# Patient Record
Sex: Female | Born: 1960 | Race: White | Hispanic: No | Marital: Married | Smoking: Former smoker
Health system: Southern US, Community
[De-identification: ages and names within clinical notes are randomized; demographics above are authoritative.]

## PROBLEM LIST (undated history)

## (undated) DIAGNOSIS — I10 Essential (primary) hypertension: Secondary | ICD-10-CM

## (undated) DIAGNOSIS — J45909 Unspecified asthma, uncomplicated: Secondary | ICD-10-CM

## (undated) DIAGNOSIS — K219 Gastro-esophageal reflux disease without esophagitis: Secondary | ICD-10-CM

## (undated) DIAGNOSIS — R519 Headache, unspecified: Secondary | ICD-10-CM

## (undated) DIAGNOSIS — K760 Fatty (change of) liver, not elsewhere classified: Secondary | ICD-10-CM

## (undated) DIAGNOSIS — R51 Headache: Secondary | ICD-10-CM

## (undated) DIAGNOSIS — R7303 Prediabetes: Secondary | ICD-10-CM

## (undated) DIAGNOSIS — R7989 Other specified abnormal findings of blood chemistry: Secondary | ICD-10-CM

## (undated) DIAGNOSIS — E785 Hyperlipidemia, unspecified: Secondary | ICD-10-CM

## (undated) DIAGNOSIS — G4730 Sleep apnea, unspecified: Secondary | ICD-10-CM

## (undated) DIAGNOSIS — B009 Herpesviral infection, unspecified: Secondary | ICD-10-CM

## (undated) DIAGNOSIS — F419 Anxiety disorder, unspecified: Secondary | ICD-10-CM

## (undated) DIAGNOSIS — R945 Abnormal results of liver function studies: Secondary | ICD-10-CM

## (undated) DIAGNOSIS — K259 Gastric ulcer, unspecified as acute or chronic, without hemorrhage or perforation: Secondary | ICD-10-CM

## (undated) DIAGNOSIS — R739 Hyperglycemia, unspecified: Secondary | ICD-10-CM

## (undated) HISTORY — PX: OTHER SURGICAL HISTORY: SHX169

## (undated) HISTORY — DX: Fatty (change of) liver, not elsewhere classified: K76.0

## (undated) HISTORY — DX: Abnormal results of liver function studies: R94.5

## (undated) HISTORY — PX: COLONOSCOPY: SHX174

## (undated) HISTORY — PX: BREAST REDUCTION SURGERY: SHX8

## (undated) HISTORY — DX: Gastric ulcer, unspecified as acute or chronic, without hemorrhage or perforation: K25.9

## (undated) HISTORY — DX: Headache, unspecified: R51.9

## (undated) HISTORY — DX: Sleep apnea, unspecified: G47.30

## (undated) HISTORY — DX: Anxiety disorder, unspecified: F41.9

## (undated) HISTORY — DX: Gastro-esophageal reflux disease without esophagitis: K21.9

## (undated) HISTORY — DX: Prediabetes: R73.03

## (undated) HISTORY — DX: Other specified abnormal findings of blood chemistry: R79.89

## (undated) HISTORY — DX: Hyperglycemia, unspecified: R73.9

## (undated) HISTORY — DX: Essential (primary) hypertension: I10

## (undated) HISTORY — DX: Headache: R51

## (undated) HISTORY — DX: Unspecified asthma, uncomplicated: J45.909

## (undated) HISTORY — PX: POLYPECTOMY: SHX149

## (undated) HISTORY — DX: Herpesviral infection, unspecified: B00.9

## (undated) HISTORY — DX: Hyperlipidemia, unspecified: E78.5

---

## 2002-05-28 ENCOUNTER — Other Ambulatory Visit: Admission: RE | Admit: 2002-05-28 | Discharge: 2002-05-28 | Payer: Self-pay | Admitting: Obstetrics and Gynecology

## 2003-07-14 ENCOUNTER — Encounter: Admission: RE | Admit: 2003-07-14 | Discharge: 2003-07-14 | Payer: Self-pay | Admitting: Family Medicine

## 2003-09-09 ENCOUNTER — Other Ambulatory Visit: Admission: RE | Admit: 2003-09-09 | Discharge: 2003-09-09 | Payer: Self-pay | Admitting: Obstetrics and Gynecology

## 2004-01-20 ENCOUNTER — Emergency Department (HOSPITAL_COMMUNITY): Admission: EM | Admit: 2004-01-20 | Discharge: 2004-01-21 | Payer: Self-pay | Admitting: Emergency Medicine

## 2004-07-13 ENCOUNTER — Ambulatory Visit (HOSPITAL_COMMUNITY): Admission: RE | Admit: 2004-07-13 | Discharge: 2004-07-13 | Payer: Self-pay | Admitting: Orthopedic Surgery

## 2005-02-07 ENCOUNTER — Other Ambulatory Visit: Admission: RE | Admit: 2005-02-07 | Discharge: 2005-02-07 | Payer: Self-pay | Admitting: Obstetrics and Gynecology

## 2005-05-23 ENCOUNTER — Encounter: Admission: RE | Admit: 2005-05-23 | Discharge: 2005-05-23 | Payer: Self-pay | Admitting: Family Medicine

## 2008-04-17 ENCOUNTER — Encounter: Admission: RE | Admit: 2008-04-17 | Discharge: 2008-04-17 | Payer: Self-pay | Admitting: Family Medicine

## 2008-11-18 ENCOUNTER — Encounter: Admission: RE | Admit: 2008-11-18 | Discharge: 2008-11-18 | Payer: Self-pay | Admitting: Obstetrics and Gynecology

## 2010-01-14 ENCOUNTER — Encounter: Admission: RE | Admit: 2010-01-14 | Discharge: 2010-01-14 | Payer: Self-pay | Admitting: Obstetrics and Gynecology

## 2010-04-06 ENCOUNTER — Encounter (INDEPENDENT_AMBULATORY_CARE_PROVIDER_SITE_OTHER): Payer: Self-pay | Admitting: *Deleted

## 2010-08-08 ENCOUNTER — Encounter: Payer: Self-pay | Admitting: Obstetrics and Gynecology

## 2010-08-16 NOTE — Letter (Signed)
Summary: Colonoscopy Letter  [facility]  [address] [place], [place] [address]   Phone: [phone]  Fax: [phone]      April 06, 2010 MRN: [id number]   DANAE OLAND [address] [place], [place]  [address]   Dear Ms. Andrey Campanile,   According to your medical record, it is time for you to schedule a Colonoscopy. The American Cancer Society recommends this procedure as a method to detect early colon cancer. Patients with a family history of colon cancer, or a personal history of colon polyps or inflammatory bowel disease are at increased risk.  This letter has beeen generated based on the recommendations made at the time of your procedure. If you feel that in your particular situation this may no longer apply, please contact our office.  Please call our office at [phone] to schedule this appointment or to update your records at your earliest convenience.  Thank you for cooperating with [place] to provide you with the very best care possible.   Sincerely,  Hedwig Morton. Juanda Chance, M.D.  [facility] [phone]

## 2010-09-30 ENCOUNTER — Other Ambulatory Visit: Payer: Self-pay | Admitting: Internal Medicine

## 2010-10-03 ENCOUNTER — Encounter (INDEPENDENT_AMBULATORY_CARE_PROVIDER_SITE_OTHER): Payer: Medicare Other | Admitting: Internal Medicine

## 2010-10-03 DIAGNOSIS — I10 Essential (primary) hypertension: Secondary | ICD-10-CM

## 2010-10-03 DIAGNOSIS — E785 Hyperlipidemia, unspecified: Secondary | ICD-10-CM

## 2010-10-03 DIAGNOSIS — E119 Type 2 diabetes mellitus without complications: Secondary | ICD-10-CM

## 2010-12-02 NOTE — Op Note (Signed)
NAME:  Stephanie Alexander, Stephanie Alexander           ACCOUNT NO.:  [id number]   MEDICAL RECORD NO.:  [id number]          PATIENT TYPE:  AMB   LOCATION:  DAY                          FACILITY:  [facility]   PHYSICIAN:  Marlowe Kays, M.D.  DATE OF BIRTH:  07/16/1961   DATE OF PROCEDURE:  07/13/2004  DATE OF DISCHARGE:                                 OPERATIVE REPORT   PREOPERATIVE DIAGNOSES:  1.  Chronic impingement syndrome with rotator cuff tendinopathy.  2.  Labral degeneration, tendinopathy of biceps tendon, and a paralabral      cyst, right shoulder.   POSTOPERATIVE DIAGNOSES:  1.  Chronic impingement syndrome with rotator cuff tendinopathy.  2.  Labral degeneration, tendinopathy of biceps tendon, and paralabral cyst,      right shoulder.   PROCEDURE:  Right shoulder arthroscopy with (1) debridement of underneath  surface of rotator cuff, biceps tendon, and labrum; (2) arthroscopic  subacromial decompression.   SURGEON:  Marlowe Kays, M.D.   ASSISTANTDruscilla Brownie. Underwood, P.A.-C.   ANESTHESIA:  General.   INDICATIONS FOR PROCEDURE:  Painful right shoulder with MRI demonstrating  the above-noted pathologic entities.  Plan was to hope to correct the  paralabral cyst which may very well not have been a pathologic entity  causing symptoms with labral debridement.   DESCRIPTION OF PROCEDURE:  After satisfactory general anesthesia in the  beach chair position on the Schlein frame, the right shoulder was prepped  with DuraPrep and draped as a sterile field.  The anatomy of the shoulder  joint was marked out.  The subacromial space, posterior and lateral portals  were infiltrated with 0.5% Marcaine with adrenaline.   Through a posterior soft spot portal, I was able to atraumatically enter the  glenohumeral joint.  On inspection, found fraying of the underneath surface  of the rotator cuff, some degenerative fraying of the biceps tendon which  was basically intact, some labral degeneration  around the biceps anchor and  also posteriorly.  The humeral head and joint looked fine.  I advanced the  scope between the biceps and subscapularis using a switching stick.  I made  an anterior incision followed by a metal cannula and introduced a 4.2 shaver  where I shaved down the underneath surface of the rotator cuff, the biceps  tendon, the biceps anchor, and the anterior and posterior labrum.  The  labrum was not detached posteriorly.  I did not see any definite connection  from the posterior joint which I felt I visualized well.  After I had  completed all I could see to do in the glenohumeral joint, I redirected the  scope in the subacromial area.  With the lateral portal, I used a 4.2 shaver  to clean up a very active subacromial bursitis.  I then used the 9-degree  vaporizer to remove soft tissue from the underneath surface of the acromion,  distal clavicle, and anterior leading edge of the acromion.  I filed this  with a 4.0 oval bur and began decompressing the subacromial space.  I went  back and forth between the bur, vaporizer, and 4.2 shaver until the  rotator  cuff was smoothed down and I felt we had wide decompression as evidenced  with the arm to the side and arm abducted.  Documentary pictures were taken.  No frank tearing of the rotator cuff was noted.  All fluid possible was  removed from the subacromial space, and the three portals and subacromial  space were once again infiltrated with 0.5% Marcaine with adrenaline.  The  three portals were closed with 4-0 nylon followed by Betadine, Adaptic, dry  sterile dressing, and shoulder immobilizer.   She tolerated the procedure well and was taken to the recovery room in  satisfactory condition with no complications.      JA/MEDQ  D:  07/13/2004  T:  07/13/2004  Job:  [id number]

## 2011-02-14 ENCOUNTER — Other Ambulatory Visit: Payer: Self-pay | Admitting: Obstetrics and Gynecology

## 2011-02-14 DIAGNOSIS — R928 Other abnormal and inconclusive findings on diagnostic imaging of breast: Secondary | ICD-10-CM

## 2011-02-24 ENCOUNTER — Ambulatory Visit
Admission: RE | Admit: 2011-02-24 | Discharge: 2011-02-24 | Disposition: A | Discharge status: Home / Self Care | Payer: BC Managed Care – PPO | Source: Ambulatory Visit | Attending: Obstetrics and Gynecology | Admitting: Obstetrics and Gynecology

## 2011-02-24 DIAGNOSIS — R928 Other abnormal and inconclusive findings on diagnostic imaging of breast: Secondary | ICD-10-CM

## 2012-04-04 ENCOUNTER — Encounter: Payer: Self-pay | Admitting: Internal Medicine

## 2014-10-02 ENCOUNTER — Other Ambulatory Visit: Payer: Self-pay | Admitting: Family Medicine

## 2014-10-02 ENCOUNTER — Ambulatory Visit
Admission: RE | Admit: 2014-10-02 | Discharge: 2014-10-02 | Disposition: A | Discharge status: Home / Self Care | Payer: No Typology Code available for payment source | Source: Ambulatory Visit | Attending: Family Medicine | Admitting: Family Medicine

## 2014-10-02 DIAGNOSIS — R0602 Shortness of breath: Secondary | ICD-10-CM

## 2016-04-25 ENCOUNTER — Encounter: Payer: Self-pay | Admitting: Neurology

## 2016-04-25 ENCOUNTER — Ambulatory Visit (INDEPENDENT_AMBULATORY_CARE_PROVIDER_SITE_OTHER): Payer: Self-pay | Admitting: Neurology

## 2016-04-25 VITALS — BP 130/86 | HR 72 | Resp 20 | Ht 64.0 in

## 2016-04-25 DIAGNOSIS — F419 Anxiety disorder, unspecified: Secondary | ICD-10-CM

## 2016-04-25 DIAGNOSIS — F10982 Alcohol use, unspecified with alcohol-induced sleep disorder: Secondary | ICD-10-CM

## 2016-04-25 DIAGNOSIS — F5105 Insomnia due to other mental disorder: Secondary | ICD-10-CM

## 2016-04-25 DIAGNOSIS — R0683 Snoring: Secondary | ICD-10-CM

## 2016-04-25 NOTE — Progress Notes (Signed)
SLEEP MEDICINE CLINIC   Provider:  Larey Seat, M D  Referring Provider: Antony Blackbird, MD Primary Care Physician:  No primary care provider on file.  Chief Complaint  Patient presents with  . New Patient (Initial Visit)    snores at night, pt refused to be weighed    HPI:  Stephanie Alexander is a 55 y.o. female , seen here as a referral/ revisit  from Dr. Chapman Fitch for a sleep consultation.  [name] husband has been an established sleep patient of mine, and has mentioned multiple times that he is concerned about his wife's snoring and poorly restorative sleep. The patient herself reports that she has been told by friends and by her husband that they have witnessed her to snore very loudly. She is apparently regularly breathing, but snoring. He reports that she snores sleeping on her side as well as on her back.  Complicating her workup has been that the patient is currently not insured.  Sleep habits are as follows: Usually in the couple's bedtime is between 11 PM and midnight but she does not have to rise until 8 AM, work begins at 11 AM. She has no trouble initiating sleep, usually tries to fall asleep on her side, uses one pillow at night. The bedroom is cool, quiet and dark. She prefers a very cool temperature. Her problem is that she does not sleep through the night however, she can wake up up to 5 or 6 times at night. She sometimes goes to the bathroom but it is not the urge to urinate that wakes her. She's not sure what leads to these arousals. She's not really sure what triggers these arousals and they seem not to have an established time. She reports very visit dreams towards the morning hours, clearly the distribution of REM sleep. She wakes up with a dry mouth. She sleeps better in the morning.  She sometimes wakes up with headaches . No day time naps.  Years ago, her insomnia started during menopause. She was placed on Lexapro and Xanax. It worked for a while but did not last-  not working any longer. 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 4 cups of coffee in AM, no Sodas, No ea. Tobacco - none, ETOH 1- 2 glasses of wine at night.   Review of Systems: Out of a complete 14 system review, the patient complains of only the following symptoms, and all other reviewed systems are negative.  Fatigue severity scale endorsed at 14 points, Epworth sleepiness scale endorsed at 3 points.  I reviewed the patient's current medication list, her last laboratory tests which all reach back to 2014 there has been one hepatitis B immunization in 2011, she carries a diagnosis of migraine, allergic rhinitis, hypertension, anxiety. She also used to have asthma. Uses now when necessary inhaler,    Social History   Social History  . Marital status: Married    Spouse name: N/A  . Number of children: N/A  . Years of education: N/A   Occupational History  . Not on file.   Social History Main Topics  . Smoking status: Former Smoker    Years: 20.00  . Smokeless tobacco: Never Used  . Alcohol use 4.2 oz/week    7 Standard drinks or equivalent per week  . Drug use: No  .  Sexual activity: Not on file   Other Topics Concern  . Not on file   Social History Narrative   Drinks 4 cups of caffeine daily.    Family History  Problem Relation Age of Onset  . Hypertension Father   . Diabetes Father   . Heart disease Father     Past Medical History:  Diagnosis Date  . Anxiety   . Asthma   . Elevated LFTs   . Headache   . Herpes simplex type 1 antibody positive   . Hypertension     Past Surgical History:  Procedure Laterality Date  . BREAST REDUCTION SURGERY    . eye surgeries      Current Outpatient Prescriptions  Medication Sig Dispense Refill  . albuterol (PROVENTIL HFA;VENTOLIN HFA) 108 (90 Base)  MCG/ACT inhaler Inhale into the lungs every 6 (six) hours as needed for wheezing or shortness of breath.    . ALPRAZolam (XANAX) 0.25 MG tablet Take 0.25 mg by mouth daily as needed for anxiety.    Marland Kitchen escitalopram (LEXAPRO) 10 MG tablet Take 10 mg by mouth daily.    Marland Kitchen losartan-hydrochlorothiazide (HYZAAR) 50-12.5 MG tablet Take 1 tablet by mouth daily.     No current facility-administered medications for this visit.     Allergies as of 04/25/2016 - Review Complete 04/25/2016  Allergen Reaction Noted  . Benicar [olmesartan]  04/24/2016  . Erythromycin  04/24/2016  . Lisinopril  04/24/2016  . Sulfa antibiotics  04/24/2016  . Sulfacetamide  04/24/2016  . Toprol xl [metoprolol succinate er]  04/24/2016    Vitals: BP 130/86   Pulse 72   Resp 20   Ht 5\' 4"  (1.626 m)  Last Weight:  Wt Readings from Last 1 Encounters:  No data found for Wt   [id number] is no height or weight on file to calculate BMI.     Last Height:   Ht Readings from Last 1 Encounters:  04/25/16 5\' 4"  (1.626 m)    Physical exam:  General: The patient is awake, alert and appears not in acute distress. The patient is well groomed. Head: Normocephalic, atraumatic. Neck is supple. Mallampati 3 ,  neck circumference: 16. Nasal airflow congested , Retrognathia is not seen.  Cardiovascular:  Regular rate and rhythm , without  murmurs or carotid bruit, and without distended neck veins. Respiratory: Lungs are clear to auscultation. Skin:  Without evidence of edema, or rash Trunk: BMI is elevated .    Neurologic exam : The patient is awake and alert, oriented to place and time.   Memory subjective described as intact.  Attention span & concentration ability appears normal.  Speech is fluent,  without dysarthria, dysphonia or aphasia.  Mood and affect are appropriate.  Cranial nerves: Pupils are equal and briskly reactive to light. Funduscopic exam without evidence of pallor or edema. Extraocular movements  in vertical  and horizontal planes intact and without nystagmus. Visual fields by finger perimetry are intact.Hearing to finger rub intact. Facial sensation intact to fine touch. Facial motor strength is symmetric and tongue and uvula move midline. Shoulder shrug was symmetrical.  Motor exam:   Normal tone, muscle bulk and symmetric strength in all extremities. Sensory:  Fine touch, pinprick and vibration were tested in all extremities. Proprioception tested in the upper extremities was normal. Coordination: Rapid alternating movements in the fingers/hands was normal. Finger-to-nose maneuver  normal without evidence of ataxia, dysmetria or tremor. Gait and station: Patient walks without assistive device and is able unassisted to  climb up to the exam table. Strength within normal limits.  Stance is stable and normal.  Deep tendon reflexes: in the  upper and lower extremities are symmetric and intact. Babinski maneuver response is downgoing.  The patient was advised of the nature of the diagnosed sleep disorder , the treatment options and risks for general a health and wellness arising from not treating the condition.  I spent more than  30 minutes of face to face time with the patient. Greater than 50% of time was spent in counseling and coordination of care. We have discussed the diagnosis and differential and I answered the patient's questions.     Assessment:  After physical and neurologic examination, review of laboratory studies,  Personal review of imaging studies, reports of other /same  Imaging studies ,  Results of polysomnography/ neurophysiology testing and pre-existing records as far as provided in visit., my assessment is   1) Snoring   2) obesity  3) sedate life style, alcohol intake.     Plan:  Treatment plan and additional workup :  HST to screen for apnea.  If treatment is necessary will do auto- PAP for cost reasons.  RV after HST.     Asencion Partridge Murlin Schrieber MD  04/25/2016   CC: Antony Blackbird, Md [address] [place], [place] [address]

## 2016-04-25 NOTE — Patient Instructions (Signed)

## 2016-04-26 ENCOUNTER — Encounter (INDEPENDENT_AMBULATORY_CARE_PROVIDER_SITE_OTHER): Payer: Self-pay | Admitting: Neurology

## 2016-04-26 DIAGNOSIS — R0683 Snoring: Secondary | ICD-10-CM

## 2016-04-26 DIAGNOSIS — G4710 Hypersomnia, unspecified: Secondary | ICD-10-CM

## 2016-04-26 DIAGNOSIS — F5105 Insomnia due to other mental disorder: Secondary | ICD-10-CM

## 2016-04-26 DIAGNOSIS — F419 Anxiety disorder, unspecified: Secondary | ICD-10-CM

## 2016-05-10 ENCOUNTER — Telehealth: Payer: Self-pay

## 2016-05-10 DIAGNOSIS — G4733 Obstructive sleep apnea (adult) (pediatric): Secondary | ICD-10-CM

## 2016-05-10 DIAGNOSIS — R0683 Snoring: Secondary | ICD-10-CM

## 2016-05-10 NOTE — Telephone Encounter (Signed)
I called pt to discuss sleep study results. No answer, left a message asking her to call me back. 

## 2016-05-16 NOTE — Telephone Encounter (Signed)
I spoke to pt and advised her that her HST revealed mild osa with an AHI of 13.2/hr without significant oxygen desaturation. Loud snoring was noted. This degree of apnea can be treated with a dental device and this would address snoring too. Pt is agreeable to a dental device and asked that the referral be sent to Dr. Mirna Mires (her dentist, who has made her a dental device before.) Pt declined a follow up appt with Dr. Brett Fairy at this time. She is working on Print production planner and will call us when she is able to make her follow up with Dr. Brett Fairy. Pt verbalized understanding of results. Pt had no questions at this time but was encouraged to call back if questions arise.

## 2016-06-20 ENCOUNTER — Telehealth: Payer: Self-pay | Admitting: Neurology

## 2016-06-20 NOTE — Telephone Encounter (Signed)
-----   Message from [place] sent at 06/20/2016 10:21 AM EST ----- Regarding: HST Patient would like a callback from you. She said she got a bill for $300 for her HST. She claims that you said it would be free. She said you could leave message on cell phone.

## 2016-06-20 NOTE — Telephone Encounter (Signed)
HST is usually  covered at 80%- 100%   in some commercial plans we see a high deductable. The insurance still considers it covered as the test is performed under [employer].  The 300 USD may have been a deductible not yet met?   I will research if the sleep lab did advise you of the costs. C Deryk Bozman, MD.     Your plan looks at co-morbidities and medication:  Antidepressants, benzodiazepine/  snoring,  Asthma, HTN. All these should have the test covered.

## 2018-06-27 ENCOUNTER — Other Ambulatory Visit: Payer: Self-pay | Admitting: Gastroenterology

## 2018-06-27 DIAGNOSIS — R748 Abnormal levels of other serum enzymes: Secondary | ICD-10-CM

## 2018-07-02 ENCOUNTER — Ambulatory Visit
Admission: RE | Admit: 2018-07-02 | Discharge: 2018-07-02 | Disposition: A | Discharge status: Home / Self Care | Payer: BLUE CROSS/BLUE SHIELD | Source: Ambulatory Visit | Attending: Gastroenterology | Admitting: Gastroenterology

## 2018-07-02 DIAGNOSIS — R748 Abnormal levels of other serum enzymes: Secondary | ICD-10-CM

## 2018-08-19 ENCOUNTER — Telehealth: Payer: Self-pay | Admitting: Neurology

## 2018-08-19 NOTE — Telephone Encounter (Signed)
Pt states her dentist advised her to get a sleep study due to the dryness in her mouth. Please advise.

## 2018-08-20 NOTE — Telephone Encounter (Signed)
Pt called states she was expecting a call back reg getting a sleep study. I advised her to contact her dentist to send referral with request. Pt is not expecting a call from RN at this point

## 2018-08-20 NOTE — Telephone Encounter (Signed)
To add the patient had a sleep study completed in 2017 and we referred to get dental device through dentist. Usually they complete their own sleep studies and adjust their own dental devices. We can certainly do this for her but patient would need a office visit first to order the sleep study and could be with either NP or MD since its for same issue she has been seen for before.

## 2018-09-02 ENCOUNTER — Telehealth: Payer: Self-pay | Admitting: Neurology

## 2018-09-02 NOTE — Telephone Encounter (Signed)
Called the patient to offer her an opening tomorrow at 3:30 pm. Patient will check with her boss and if they cant make that work she will call me right back.  I have blocked this slot

## 2018-09-03 ENCOUNTER — Ambulatory Visit: Payer: BLUE CROSS/BLUE SHIELD | Admitting: Neurology

## 2018-09-03 ENCOUNTER — Encounter: Payer: Self-pay | Admitting: Neurology

## 2018-09-03 VITALS — BP 121/77 | HR 67 | Ht 64.0 in | Wt 195.0 lb

## 2018-09-03 DIAGNOSIS — G4733 Obstructive sleep apnea (adult) (pediatric): Secondary | ICD-10-CM | POA: Diagnosis not present

## 2018-09-03 DIAGNOSIS — E669 Obesity, unspecified: Secondary | ICD-10-CM | POA: Diagnosis not present

## 2018-09-03 DIAGNOSIS — G4730 Sleep apnea, unspecified: Secondary | ICD-10-CM

## 2018-09-03 DIAGNOSIS — R0683 Snoring: Secondary | ICD-10-CM | POA: Diagnosis not present

## 2018-09-03 DIAGNOSIS — G4710 Hypersomnia, unspecified: Secondary | ICD-10-CM | POA: Diagnosis not present

## 2018-09-03 DIAGNOSIS — Z6832 Body mass index (BMI) 32.0-32.9, adult: Secondary | ICD-10-CM | POA: Insufficient documentation

## 2018-09-03 NOTE — Patient Instructions (Signed)

## 2018-09-03 NOTE — Telephone Encounter (Signed)
pt confirm today's appt-she will ckin around 3:10  FYI

## 2018-09-03 NOTE — Progress Notes (Signed)
SLEEP MEDICINE CLINIC   Provider:  Larey Seat, MD    Referring Provider: Marda Stalker, PA-C Primary Care Physician:  Marda Stalker, PA-C  Chief Complaint  Patient presents with  . Follow-up    pt alone, rm 11. pt states that she had a sleep study several yrs ago she went to dentist. she states that she never started with a dental device. pt would like to repeat a sleep study and potentially look to see if she would qualify for a CPAP    HPI:  Stephanie Alexander is a meanwhile  58 y.o. female patient and seen today in referral from her dentist, Dr Evelene Croon,    I had previously seen her in 11-2017upon referral by from [place] for a sleep consultation. At the time she lacked health insurance and only a HST was affordable, revealing an AHI of 13.2 on apnea link. Treatment was not financially feasible.    09-03-2018, Stephanie Alexander had seen Dr. Evelene Croon earlier this months February 2020 and he felt that she should return for an in lab CPAP titration if appropriate she had discussed with him her previous results of a sleep study and he had also indicated that he could help with a dental appliance if the patient would turn out to be a good candidate.  In the meantime she had tried her husband's auto titration CPAP for 2 nights and she felt she slept very well.  This has convinced her that she could at least try CPAP.  She endorsed today's fatigue severity score at 21 point on the Epworth Sleepiness Scale at 13 out of 24 points her sleepiness degree is definitely elevated.  She wakes up with a very dry parched mouth every morning and she felt that the humidified air and the CPAP also helped her.  She felt better restored and refreshed after using CPAP.  Stephanie Alexander husband has been an established sleep patient of mine, and has mentioned multiple times that he is concerned about his wife's snoring and poorly restorative sleep.The patient herself reports that she has been told by friends  and by her husband that they have witnessed her to snore very loudly. She is apparently regularly breathing, but snoring. He reports that she snores sleeping on her side as well as on her back.    Sleep habits are as follows: Usually in the couple's bedtime is between 11 PM and midnight but she does not have to rise until 8 AM, work begins at 11 AM. She has no trouble initiating sleep, usually tries to fall asleep on her side, uses one pillow at night. The bedroom is cool, quiet and dark. She prefers a very cool temperature. Her problem is that she does not sleep through the night however, she can wake up up to 5 or 6 times at night. She sometimes goes to the bathroom but it is not the urge to urinate that wakes her. She's not sure what leads to these arousals. She's not really sure what triggers these arousals and they seem not to have an established time. She reports very visit dreams towards the morning hours, clearly the distribution of REM sleep. She wakes up with a dry mouth. She sleeps better in the morning.  She sometimes wakes up with headaches . No day time naps.  Years ago, her insomnia started during menopause. She was placed on Lexapro and Xanax. It worked for a while but did not last- not working any longer. 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 4 cups of coffee in AM, no Sodas, No ea. Tobacco - none, ETOH 1- 2 glasses of wine at night.   Review of Systems: Out of a complete 14 system review, the patient complains of only the following symptoms, and all other reviewed systems are negative.  Fatigue severity scale endorsed at 14 points, Epworth sleepiness scale endorsed at 3 points.  I reviewed the patient's current medication list, her last laboratory tests which all reach back to 2014 there has been one hepatitis B  immunization in 2011, she carries a diagnosis of migraine, allergic rhinitis, hypertension, anxiety. She also used to have asthma. Uses now when necessary inhaler,    Social History   Socioeconomic History  . Marital status: Married    Spouse name: Not on file  . Number of children: Not on file  . Years of education: Not on file  . Highest education level: Not on file  Occupational History  . Not on file  Social Needs  . Financial resource strain: Not on file  . Food insecurity:    Worry: Not on file    Inability: Not on file  . Transportation needs:    Medical: Not on file    Non-medical: Not on file  Tobacco Use  . Smoking status: Former Smoker    Years: 20.00  . Smokeless tobacco: Never Used  Substance and Sexual Activity  . Alcohol use: Yes    Alcohol/week: 7.0 standard drinks    Types: 7 Standard drinks or equivalent per week  . Drug use: No  . Sexual activity: Not on file  Lifestyle  . Physical activity:    Days per week: Not on file    Minutes per session: Not on file  . Stress: Not on file  Relationships  . Social connections:    Talks on phone: Not on file    Gets together: Not on file    Attends religious service: Not on file    Active member of club or organization: Not on file    Attends meetings of clubs or organizations: Not on file    Relationship status: Not on file  . Intimate partner violence:    Fear of current or ex partner: Not on file    Emotionally abused: Not on file    Physically abused: Not on file    Forced sexual activity: Not on file  Other Topics Concern  . Not on file  Social History Narrative   Drinks 4 cups of caffeine daily.    Family History  Problem Relation Age of Onset  . Hypertension Father   . Diabetes Father   . Heart disease Father     Past Medical History:  Diagnosis Date  . Anxiety   . Asthma   . Elevated LFTs   . Headache   . Herpes simplex type 1 antibody positive   . Hypertension       Allergies  as of 09/03/2018 - Review Complete 09/03/2018  Allergen Reaction Noted  . Benicar [olmesartan]  04/24/2016  . Erythromycin  04/24/2016  . Lisinopril  04/24/2016  . Sulfa antibiotics  04/24/2016  . Sulfacetamide  04/24/2016  . Toprol xl [metoprolol tartrate]  04/24/2016    Vitals: BP 121/77   Pulse 67   Ht 5\' 4"  (1.626  m)   Wt 195 lb (88.5 kg)   BMI 33.47 kg/m  Last Weight:  Wt Readings from Last 1 Encounters:  09/03/18 195 lb (88.5 kg)   WSF:KCLE mass index is 33.47 kg/m.     Last Height:   Ht Readings from Last 1 Encounters:  09/03/18 5\' 4"  (1.626 m)    Physical exam:  General: The patient is awake, alert and appears not in acute distress. The patient is well groomed. Head: Normocephalic, atraumatic. Neck is supple. Mallampati 3 , small mouth opening - uvula tip touches the tongue.  neck circumference: 16" . Nasal airflow patent ,  Retrognathia is mild-  She has a painless TMJ click.  Cardiovascular:  Regular rate and rhythm- without  murmurs or carotid bruit, and without distended neck veins. Skin:  Without evidence of edema, or rash Trunk: BMI is elevated at 33.47 kg/m2.    Neurologic exam : The patient is awake and alert, oriented to place and time.   Memory subjective described as intact.  Speech is fluent, without dysarthria, dysphonia or aphasia.  Mood and affect are appropriate.  Cranial nerves: Pupils are equal and briskly reactive to light.  Extraocular movements  in vertical and horizontal planes intact and without nystagmus. Visual fields by finger perimetry are intact. Hearing to finger rub intact.  Facial motor strength is symmetric and tongue and uvula move midline.Motor exam:  Normal tone, muscle bulk and symmetric strength in all extremities. Deep tendon reflexes: in the  upper and lower extremities are symmetric and intact. Babinski maneuver response is downgoing.  The patient was advised of the nature of the diagnosed sleep disorder , the treatment  options and risks for general a health and wellness arising from not treating the condition.  I spent more than 25 minutes of face to face time with the patient. Greater than 50% of time was spent in counseling and coordination of care. We have discussed the diagnosis and differential and I answered the patient's questions.     Assessment:  After physical and neurologic examination, review of laboratory studies,  Personal review of imaging studies, reports of other /same  Imaging studies ,  Results of polysomnography/ neurophysiology testing and pre-existing records as far as provided in visit., my assessment is   1) she continues to snore, has a dry mouth and excessive daytime sleepiness. Snoring   2) Obesity- has not been able to lose weight over the last 2.5 years.  She started nutrasystem last week, not yet enrolled in regular exercising   Plan:  Treatment plan and additional workup : I agree with dr. Evelene Croon and will ask the patient to come in for an attended sleep study /  [facility].   RV after test.    Larey Seat MD  09/03/2018   CC: Marda Stalker, [address] [place], [place] [id number]

## 2018-12-18 ENCOUNTER — Telehealth: Payer: Self-pay

## 2018-12-18 NOTE — Telephone Encounter (Signed)
We have attempted to call the patient two times to schedule sleep study.  Patient has been unavailable at the phone numbers we have on file and has not returned our calls.  At this point we will send a letter asking patient to please contact the sleep lab to schedule their sleep study.  If patient calls back we will schedule them for their sleep study. 

## 2019-01-27 ENCOUNTER — Ambulatory Visit (INDEPENDENT_AMBULATORY_CARE_PROVIDER_SITE_OTHER): Payer: BC Managed Care – PPO | Admitting: Neurology

## 2019-01-27 DIAGNOSIS — G4710 Hypersomnia, unspecified: Secondary | ICD-10-CM

## 2019-01-27 DIAGNOSIS — G478 Other sleep disorders: Secondary | ICD-10-CM

## 2019-01-27 DIAGNOSIS — E669 Obesity, unspecified: Secondary | ICD-10-CM

## 2019-01-27 DIAGNOSIS — R0683 Snoring: Secondary | ICD-10-CM

## 2019-02-02 NOTE — Procedures (Signed)
PATIENT'S NAME:  Kennadi, [place] DOB:      Mar 27, 1961      MR#:    [id number]     DATE OF RECORDING: 01/27/2019 REFERRING M.D.:  Marda Stalker PA-C Study Performed:   Baseline Polysomnogram HISTORY:  58 y.o. female patient and seen on 09-03-2018 upon referral from her dentist, Dr Evelene Croon, [place] had previously seen the patient in 2017 upon referral by from [place] for a sleep consultation. At the time she lacked health insurance and only a HST was affordable, revealing an AHI of 13.2/h on apnea link. Treatment was not financially feasible.     Mrs. Cleverly had seen Dr. Evelene Croon earlier this month( February 2020) and he felt that she should return for an in lab CPAP titration if appropriate- she had discussed with him her previous results of a sleep study and he had also indicated that he could help with a dental appliance if the patient would turn out to be a good candidate.  In the meantime she had tried her husband's auto titration CPAP for 2 nights and she felt she slept very well.  This has convinced her that she could at least try CPAP.  She endorsed today's fatigue severity score at 21 point on the Epworth Sleepiness Scale at 13 / 24 points= her sleepiness degree is definitely elevated.  She wakes up with a very dry parched mouth every morning and she felt that the humidified air and the CPAP also helped her.  She felt better restored and refreshed after using his CPAP.   Mrs. Barbour husband has been an established sleep patient of mine, and has mentioned multiple times that he is concerned about his wife's snoring and poorly restorative sleep. The patient herself reports that she has been told by friends and by her husband that they have witnessed her to snore very loudly. She is apparently regularly breathing, but snoring. He reports that she snores sleeping on her side as well as on her back.    The patient endorsed the Epworth Sleepiness Scale at 13 points.   The patient's weight 195  pounds with a height of 64 (inches), resulting in a BMI of 33.1 kg/m2. The patient's neck circumference measured 16 inches.  CURRENT MEDICATIONS: None   PROCEDURE:  This is a multichannel digital polysomnogram utilizing the Somnostar 11.2 system.  Electrodes and sensors were applied and monitored per AASM Specifications.   EEG, EOG, Chin and Limb EMG, were sampled at 200 Hz.  ECG, Snore and Nasal Pressure, Thermal Airflow, Respiratory Effort, CPAP Flow and Pressure, Oximetry was sampled at 50 Hz. Digital video and audio were recorded.      BASELINE STUDY: Lights Out was at 22:41 and Lights On at 05:00.  Total recording time (TRT) was 379.5 minutes, with a total sleep time (TST) of 316 minutes.  The patient's sleep latency was 27.5 minutes.  REM latency was 196.5 minutes. The sleep efficiency was 83.3 %.     SLEEP ARCHITECTURE: WASO (Wake after sleep onset) was 35.5 minutes.  There were 21 minutes in Stage N1, 49.5 minutes Stage N2, 231 minutes Stage N3 and 14.5 minutes in Stage REM. The percentage of Stage N1 was 6.6%, Stage N2 was 15.7%, Stage N3 was 73.1% and Stage R (REM sleep) was only 4.6%.   RESPIRATORY ANALYSIS:  There were a total of 45 respiratory events:  6 obstructive apneas, 0 central apneas and 0 39 hypopneas.    The total APNEA/HYPOPNEA  INDEX (AHI) was 8.5 /hour.  4 events occurred in REM sleep and 70 events in NREM. The REM AHI was 16.6 /hour, versus a non-REM AHI of 8.2. The patient spent 17 minutes of total sleep time in the supine position and 299 minutes in non-supine. The supine AHI was 81.2 versus a non-supine AHI of 4.4.  OXYGEN SATURATION & C02:  The Wake baseline 02 saturation was 95%, with the lowest being 80%. Time spent below 89% saturation equaled 64 minutes.  The arousals were noted as: 60 were spontaneous, 0 were associated with PLMs, and 25 were associated with respiratory events. There was strong and almost continuous snoring noted.  The patient had a total of 0  Periodic Limb Movements.    Audio and video analysis did not show any abnormal or unusual movements, behaviors, phonations or vocalizations.   EKG was in keeping with normal sinus rhythm (NSR).  IMPRESSION: Mild overall Obstructive Sleep Apnea (OSA) with an AHI of 8.5/h.  There was loud snoring noted, many additional arousals. REM sleep doubled the AHI to 16.6/h.   RECOMMENDATIONS:  1. Advise auto CPAP titration with heated humidity to optimize therapy. Auto settings would be 5-15 cm water pressure, 3 cm EPR and mask /interface of this patient's choice.  2. Plan B: REM dependent apnea (almost 100% increase in AHI during REM sleep ) is less likely to respond to a dental device, however the non- REM sleep AHI of 8.2/h and snoring could still be improved by dental means.    I certify that I have reviewed the entire raw data recording prior to the issuance of this report in accordance with the Standards of Accreditation of the American Academy of Sleep Medicine (AASM)    Larey Seat, MD  01-31-2019  Diplomat, American Board of Psychiatry and Neurology  Diplomat, American Board of [place] Director, [facility] [employer]

## 2019-02-02 NOTE — Addendum Note (Signed)
Addended by: Larey Seat on: 02/02/2019 06:12 PM   Modules accepted: Orders

## 2019-02-04 ENCOUNTER — Telehealth: Payer: Self-pay | Admitting: Neurology

## 2019-02-04 NOTE — Telephone Encounter (Signed)
I called Stephanie Alexander. I advised Stephanie Alexander that Dr. Brett Fairy reviewed their sleep study results and found that Stephanie Alexander has sleep apnea. Dr. Brett Fairy recommends that Stephanie Alexander starts auto CPAP 5-15. I reviewed PAP compliance expectations with the Stephanie Alexander. Stephanie Alexander is agreeable to starting a CPAP. I advised Stephanie Alexander that an order will be sent to a DME, [facility], and [facility] will call the Stephanie Alexander within about one week after they file with the Stephanie Alexander's insurance. [facility] will show the Stephanie Alexander how to use the machine, fit for masks, and troubleshoot the CPAP if needed. A follow up appt was made for insurance purposes with Ward Givens, NP on Sept 29,2020 at 9 am. Stephanie Alexander verbalized understanding to arrive 15 minutes early and bring their CPAP. A letter with all of this information in it will be mailed to the Stephanie Alexander as a reminder. I verified with the Stephanie Alexander that the address we have on file is correct. Stephanie Alexander verbalized understanding of results. Stephanie Alexander had no questions at this time but was encouraged to call back if questions arise. I have sent the order to [facility] and have received confirmation that they have received the order.

## 2019-02-04 NOTE — Telephone Encounter (Signed)
-----   Message from Larey Seat, MD sent at 02/02/2019  6:12 PM EDT ----- IMPRESSION: Mild overall Obstructive Sleep Apnea (OSA) with an  AHI of 8.5/h. There was loud snoring noted, many additional  arousals. REM sleep doubled the AHI to 16.6/h.   RECOMMENDATIONS:   1. Advise auto CPAP titration with heated humidity to optimize  therapy. Auto settings would be 5-15 cm water pressure, 3 cm EPR  and mask /interface of this patient's choice.  2. Plan B: REM dependent apnea (almost 100% increase in AHI  during REM sleep ) is less likely to respond to a dental device,  however the non- REM sleep AHI of 8.2/h and snoring could still  be improved by dental means.     RN Mardene Celeste: send Cc to Dr Evelene Croon, [place] - referring dentist, and please send to PCP.

## 2019-04-03 ENCOUNTER — Telehealth: Payer: Self-pay | Admitting: Neurology

## 2019-04-03 NOTE — Telephone Encounter (Signed)
Reached out to [facility] for the patient to get more advice on what to offer the patient and they replied,  "I talked with her yesterday. She stated that the cushion for her mask has holes because she's been using the same one for six weeks. We do not have any of her particular cushion in stock. I will have new cushions shipped to her. "  I asked if [facility] could contact the patient to advise her they can ship out the nasal pillows she needs with a estimated shipment date. The patient will have to get the replacement from the DME company. [facility] can get these for her, they will just have to be mailed.  [facility] states they will contact the patient to make her aware.

## 2019-04-03 NOTE — Telephone Encounter (Signed)
Pt called stating that she has been using the same nose pillow on her cpap machine for 2 months and when she called [facility] they informed her that they did not have that and they could not provider her with another one. Pt is needing to know where she can get a replacement because this one has holes in it. Please advise.

## 2019-04-15 ENCOUNTER — Ambulatory Visit: Payer: Self-pay | Admitting: Adult Health

## 2019-07-23 IMAGING — US US ABDOMEN COMPLETE
1 series · 14 of 25 positions shown · non-contrast
Comparison: Ultrasound of the abdomen of 07/14/2003

CLINICAL DATA: Elevated liver function tests

EXAM:
ABDOMEN ULTRASOUND COMPLETE

[Series 1: us abdomen complete · 0.15mm/px · 14 of 74 slices shown]
[im 1/74]
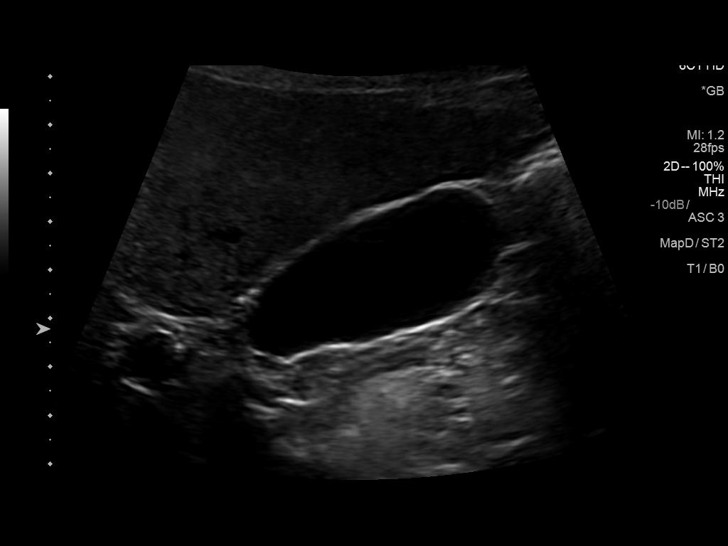
[im 7/74]
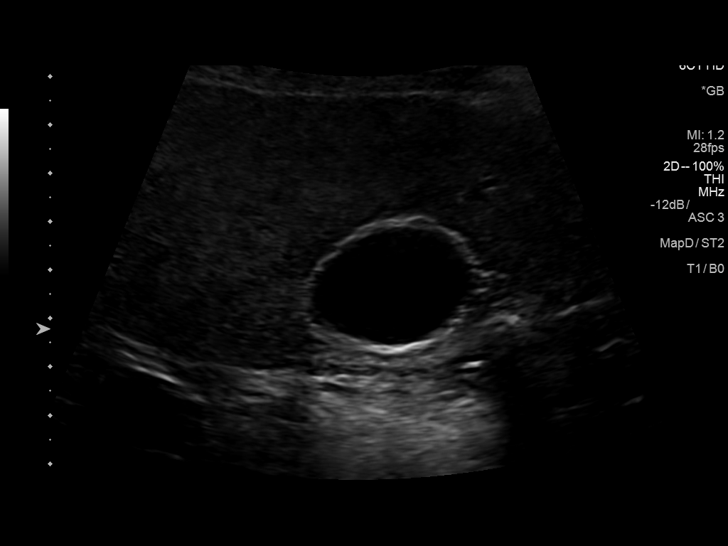
[im 13/74]
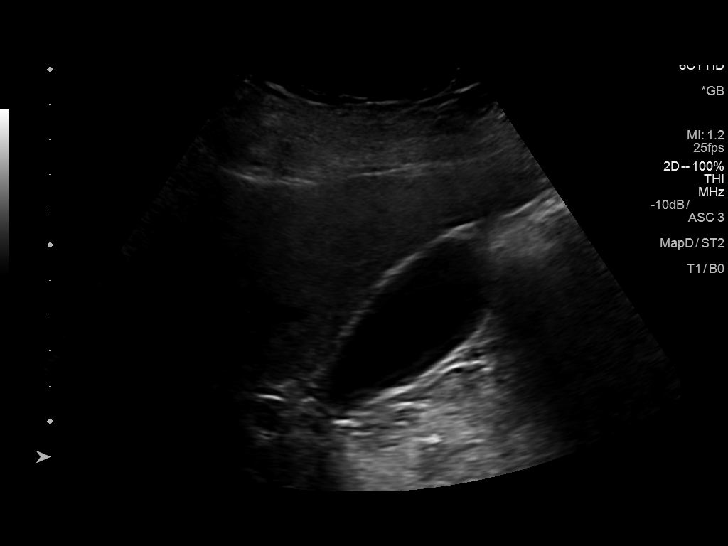
[im 19/74]
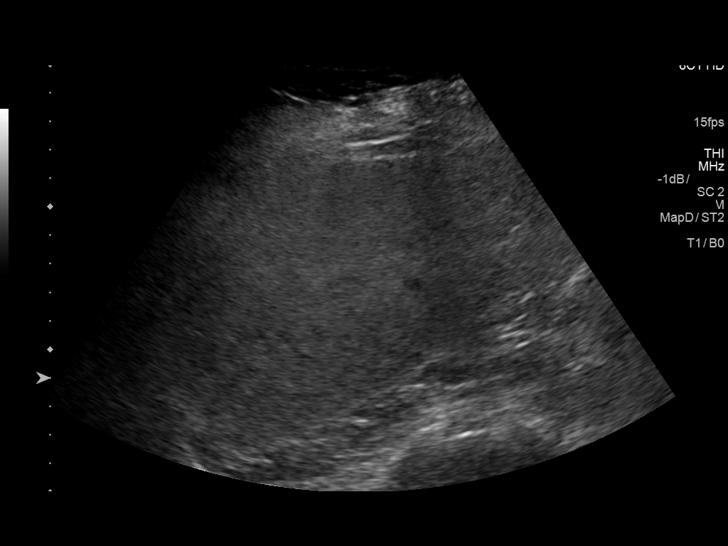
[im 25/74]
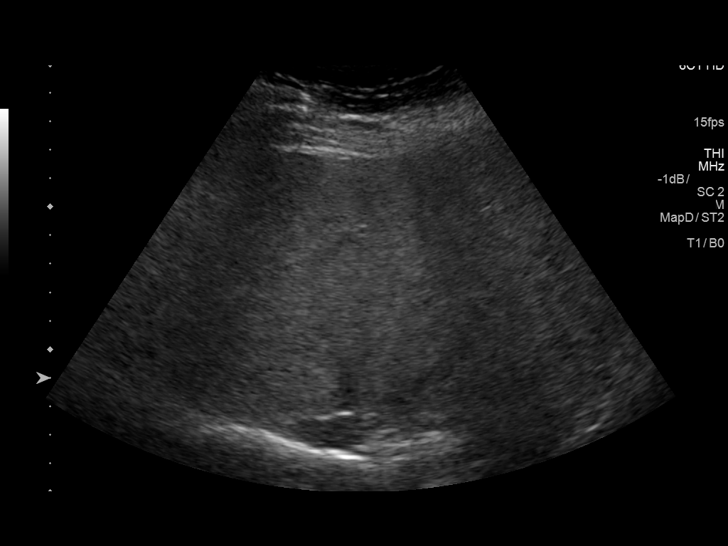
[im 28/74]
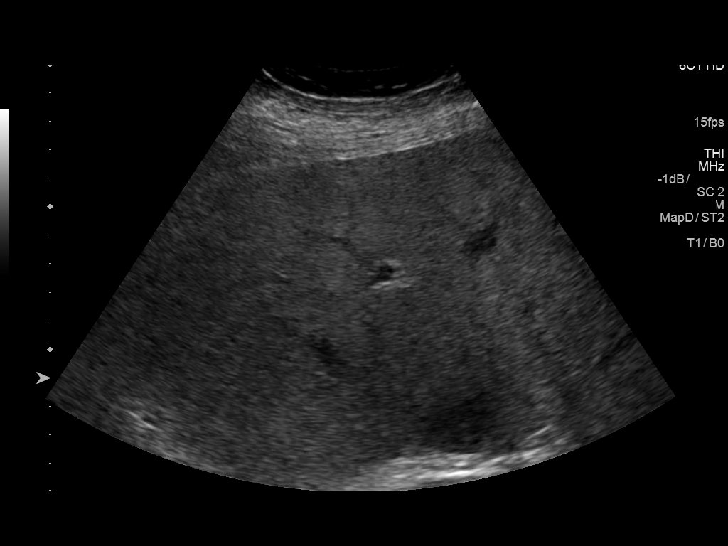
[im 34/74]
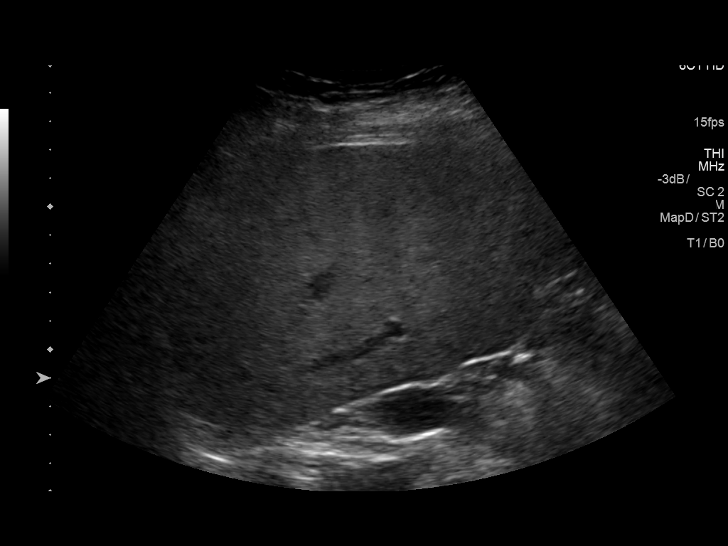
[im 40/74]
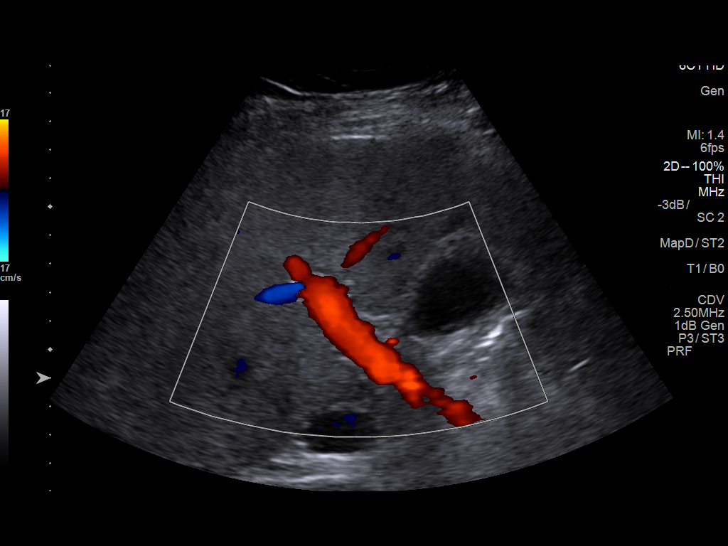
[im 46/74]
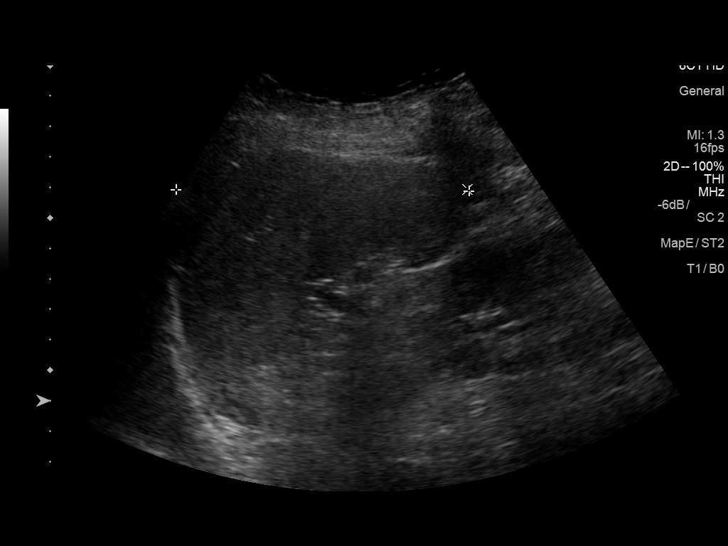
[im 49/74]
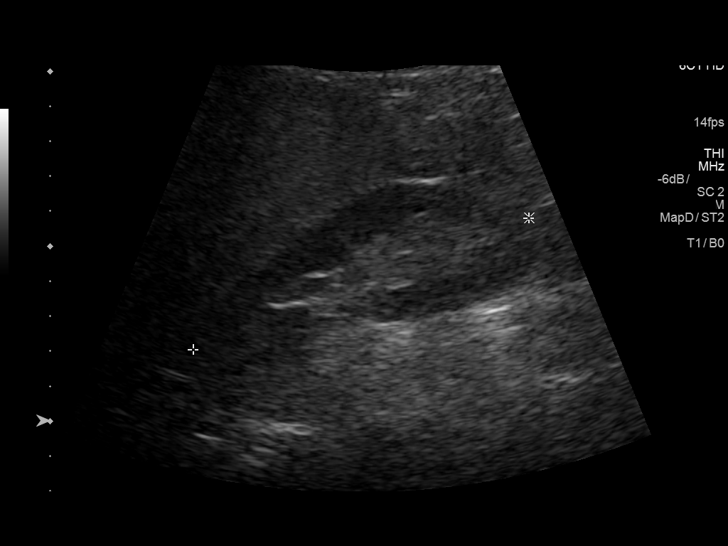
[im 55/74]
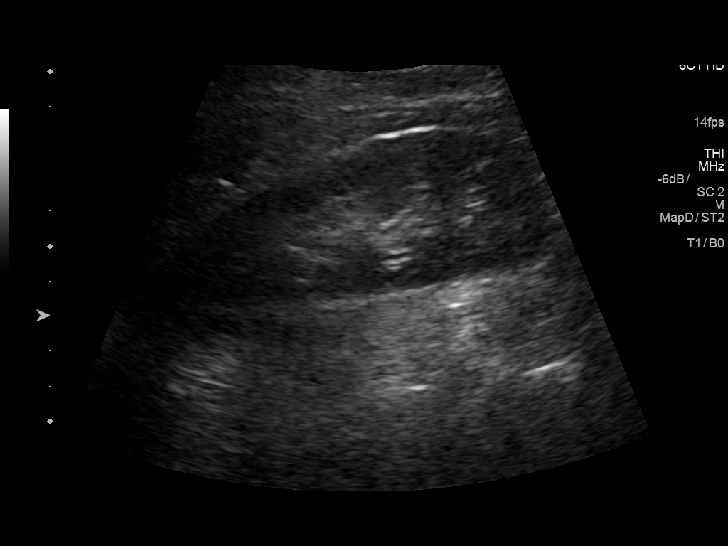
[im 61/74]
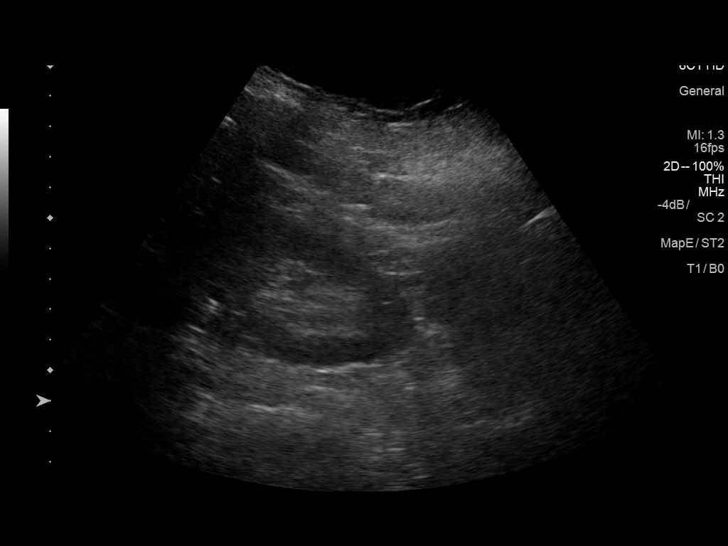
[im 67/74]
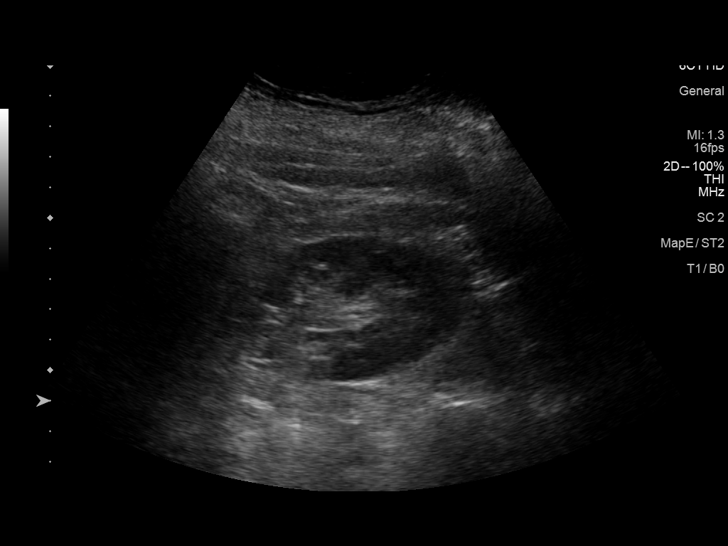
[im 74/74]
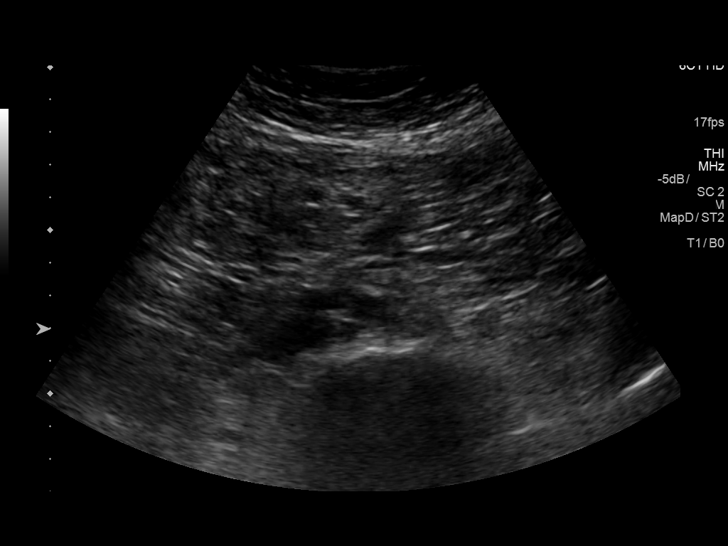

[14 of 25 positions shown; findings below may reference images not displayed]

FINDINGS: Gallbladder: The gallbladder is visualized and no gallstones are
noted. There is no pain over the gallbladder with compression.

Common bile duct: Diameter: The common bile duct is normal measuring
1.8 mm in diameter.

Liver: The parenchyma of the liver is echogenic and inhomogeneous
consistent with hepatic steatosis. No focal hepatic abnormality is
seen. Portal vein is patent on color Doppler imaging with normal
direction of blood flow towards the liver.

IVC: The IVC is completely obscured by bowel gas.

Pancreas: The pancreas is obscured by bowel gas.

Spleen: The spleen measures 9.6 cm.

Right Kidney: Length: 11.0 cm..  No hydronephrosis is seen.

Left Kidney: Length: 12.1 cm..  No hydronephrosis is noted.

Abdominal aorta: The abdominal aorta is normal in caliber.

Other findings: None.
IMPRESSION: 1. Echogenic inhomogeneous liver parenchyma consistent with hepatic
steatosis. No focal hepatic abnormality.
2. No gallstones.
3. The pancreas is completely obscured by bowel gas and and not be
evaluated.

## 2020-02-26 ENCOUNTER — Encounter (INDEPENDENT_AMBULATORY_CARE_PROVIDER_SITE_OTHER): Payer: Self-pay | Admitting: Family Medicine

## 2020-02-26 ENCOUNTER — Ambulatory Visit (INDEPENDENT_AMBULATORY_CARE_PROVIDER_SITE_OTHER): Payer: BC Managed Care – PPO | Admitting: Family Medicine

## 2020-02-26 ENCOUNTER — Other Ambulatory Visit: Payer: Self-pay

## 2020-02-26 VITALS — BP 150/76 | HR 45 | Temp 98.1°F | Ht 64.0 in | Wt 190.0 lb

## 2020-02-26 DIAGNOSIS — Z9189 Other specified personal risk factors, not elsewhere classified: Secondary | ICD-10-CM

## 2020-02-26 DIAGNOSIS — R7303 Prediabetes: Secondary | ICD-10-CM

## 2020-02-26 DIAGNOSIS — R0602 Shortness of breath: Secondary | ICD-10-CM

## 2020-02-26 DIAGNOSIS — R5383 Other fatigue: Secondary | ICD-10-CM

## 2020-02-26 DIAGNOSIS — Z1331 Encounter for screening for depression: Secondary | ICD-10-CM

## 2020-02-26 DIAGNOSIS — E7849 Other hyperlipidemia: Secondary | ICD-10-CM

## 2020-02-26 DIAGNOSIS — E669 Obesity, unspecified: Secondary | ICD-10-CM

## 2020-02-26 DIAGNOSIS — Z0289 Encounter for other administrative examinations: Secondary | ICD-10-CM

## 2020-02-26 DIAGNOSIS — I10 Essential (primary) hypertension: Secondary | ICD-10-CM | POA: Diagnosis not present

## 2020-02-26 DIAGNOSIS — Z6832 Body mass index (BMI) 32.0-32.9, adult: Secondary | ICD-10-CM

## 2020-02-27 LAB — CBC WITH DIFFERENTIAL/PLATELET
Basophils Absolute: 0 10*3/uL (ref 0.0–0.2)
Basos: 0 %
EOS (ABSOLUTE): 0.2 10*3/uL (ref 0.0–0.4)
Eos: 3 %
Hematocrit: 42.2 % (ref 34.0–46.6)
Hemoglobin: 14.7 g/dL (ref 11.1–15.9)
Immature Grans (Abs): 0 10*3/uL (ref 0.0–0.1)
Immature Granulocytes: 0 %
Lymphocytes Absolute: 2.5 10*3/uL (ref 0.7–3.1)
Lymphs: 36 %
MCH: 31.3 pg (ref 26.6–33.0)
MCHC: 34.8 g/dL (ref 31.5–35.7)
MCV: 90 fL (ref 79–97)
Monocytes Absolute: 0.4 10*3/uL (ref 0.1–0.9)
Monocytes: 6 %
Neutrophils Absolute: 3.8 10*3/uL (ref 1.4–7.0)
Neutrophils: 55 %
Platelets: 195 10*3/uL (ref 150–450)
RBC: 4.69 x10E6/uL (ref 3.77–5.28)
RDW: 13 % (ref 11.7–15.4)
WBC: 7 10*3/uL (ref 3.4–10.8)

## 2020-02-27 LAB — COMPREHENSIVE METABOLIC PANEL
ALT: 62 IU/L — ABNORMAL HIGH (ref 0–32)
AST: 35 IU/L (ref 0–40)
Albumin/Globulin Ratio: 2 (ref 1.2–2.2)
Albumin: 4.6 g/dL (ref 3.8–4.9)
Alkaline Phosphatase: 115 IU/L (ref 48–121)
BUN/Creatinine Ratio: 21 (ref 12–28)
BUN: 14 mg/dL (ref 8–27)
Bilirubin Total: 1.3 mg/dL — ABNORMAL HIGH (ref 0.0–1.2)
CO2: 25 mmol/L (ref 20–29)
Calcium: 10.6 mg/dL — ABNORMAL HIGH (ref 8.7–10.3)
Chloride: 100 mmol/L (ref 96–106)
Creatinine, Ser: 0.67 mg/dL (ref 0.57–1.00)
GFR calc Af Amer: 110 mL/min/{1.73_m2} (ref >59)
GFR calc non Af Amer: 96 mL/min/{1.73_m2} (ref >59)
Globulin, Total: 2.3 g/dL (ref 1.5–4.5)
Glucose: 243 mg/dL — ABNORMAL HIGH (ref 65–99)
Potassium: 4.3 mmol/L (ref 3.5–5.2)
Sodium: 139 mmol/L (ref 134–144)
Total Protein: 6.9 g/dL (ref 6.0–8.5)

## 2020-02-27 LAB — T3: T3, Total: 121 ng/dL (ref 71–180)

## 2020-02-27 LAB — FOLATE: Folate: 15.3 ng/mL (ref >3.0)

## 2020-02-27 LAB — HEMOGLOBIN A1C
Est. average glucose Bld gHb Est-mCnc: 246 mg/dL
Hgb A1c MFr Bld: 10.2 % — ABNORMAL HIGH (ref 4.8–5.6)

## 2020-02-27 LAB — LIPID PANEL WITH LDL/HDL RATIO
Cholesterol, Total: 173 mg/dL (ref 100–199)
HDL: 45 mg/dL (ref >39)
LDL Chol Calc (NIH): 102 mg/dL — ABNORMAL HIGH (ref 0–99)
LDL/HDL Ratio: 2.3 ratio (ref 0.0–3.2)
Triglycerides: 149 mg/dL (ref 0–149)
VLDL Cholesterol Cal: 26 mg/dL (ref 5–40)

## 2020-02-27 LAB — INSULIN, RANDOM: INSULIN: 11.9 u[IU]/mL (ref 2.6–24.9)

## 2020-02-27 LAB — VITAMIN D 25 HYDROXY (VIT D DEFICIENCY, FRACTURES): Vit D, 25-Hydroxy: 32.9 ng/mL (ref 30.0–100.0)

## 2020-02-27 LAB — TSH: TSH: 2.43 u[IU]/mL (ref 0.450–4.500)

## 2020-02-27 LAB — T4, FREE: Free T4: 1.15 ng/dL (ref 0.82–1.77)

## 2020-02-27 LAB — VITAMIN B12: Vitamin B-12: 505 pg/mL (ref 232–1245)

## 2020-03-02 NOTE — Progress Notes (Signed)
Chief Complaint:   OBESITY Stephanie Alexander (MR# [id number]) is a 59 y.o. female who presents for evaluation and treatment of obesity and related comorbidities. Current BMI is Body mass index is 32.61 kg/m. Stephanie Alexander has been struggling with her weight for many years and has been unsuccessful in either losing weight, maintaining weight loss, or reaching her healthy weight goal.  Stephanie Alexander is currently in the action stage of change and ready to dedicate time achieving and maintaining a healthier weight. Stephanie Alexander is interested in becoming our patient and working on intensive lifestyle modifications including (but not limited to) diet and exercise for weight loss.  Stephanie Alexander's habits were reviewed today and are as follows: Her family eats meals together, she thinks her family will eat healthier with her, her desired weight loss is 40 lbs, she has been heavy most of her life, she started gaining weight 10 years ago, she has significant food cravings issues, she snacks frequently in the evenings, she skips meals frequently, she is frequently drinking liquids with calories and she frequently eats larger portions than normal.  Depression Screen Stephanie Alexander's Food and Mood (modified PHQ-9) score was 1.  Depression screen [facility] 2/9 02/26/2020  Decreased Interest 0  Down, Depressed, Hopeless 0  PHQ - 2 Score 0  Altered sleeping 0  Tired, decreased energy 0  Change in appetite 1  Feeling bad or failure about yourself  0  Trouble concentrating 0  Moving slowly or fidgety/restless 0  Suicidal thoughts 0  PHQ-9 Score 1  Difficult doing work/chores Not difficult at all   Subjective:   1. Other fatigue Stephanie Alexander admits to daytime somnolence and denies waking up still tired. Patent has a history of symptoms of daytime fatigue and morning headache. [name] generally gets 8 hours of sleep per night, and states that she has generally restful sleep. Snoring is present. Apneic episodes are not  present. Epworth Sleepiness Score is 5. [name] notes mild fatigue, and this is stable with using CPAP nightly.  2. Shortness of breath on exertion Stephanie Alexander notes increasing shortness of breath with exercising and seems to be worsening over time with weight gain. She notes getting out of breath sooner with activity than she used to. This has not gotten worse recently. [name] denies shortness of breath at rest or orthopnea.  3. Essential hypertension Stephanie Alexander's blood pressure is elevated today. She is on medications and is attempting to improve with diet.  4. Pre-diabetes Stephanie Alexander is on metformin, and she is working on diet and weight loss.  5. Other hyperlipidemia Stephanie Alexander is not on statin, and she was recently told her cholesterol was elevated. She is working on diet.  6. At risk for diabetes mellitus Stephanie Alexander is at higher than average risk for developing diabetes due to her obesity.   Assessment/Plan:   1. Other fatigue [name] does feel that her weight is causing her energy to be lower than it should be. Fatigue may be related to obesity, depression or many other causes. Labs will be ordered, and in the meanwhile, [name] will focus on self care including making healthy food choices, increasing physical activity and focusing on stress reduction.  - EKG 12-Lead - CBC with Differential/Platelet - Folate - VITAMIN D 25 Hydroxy (Vit-D Deficiency, Fractures) - TSH - T4, free - T3 - Vitamin B12  2. Shortness of breath on exertion [name] does feel that she gets out of breath more easily that she used to when she exercises. Stephanie Alexander's shortness of breath appears to  be obesity related and exercise induced. She has agreed to work on weight loss and gradually increase exercise to treat her exercise induced shortness of breath. Will continue to monitor closely.  - CBC with Differential/Platelet  3. Essential hypertension Stephanie Alexander will continue her  medications, and will continue working on healthy weight loss and exercise to improve blood pressure control. We will watch for signs of hypotension as she continues her lifestyle modifications. We will check labs today, and we will recheck her blood pressure in 2 weeks.  - Comprehensive metabolic panel  4. Pre-diabetes Stephanie Alexander will continue her metformin, and will continue to work on weight loss, exercise, and decreasing simple carbohydrates to help decrease the risk of diabetes. We will check labs today.  - Hemoglobin A1c - Insulin, random  5. Other hyperlipidemia Cardiovascular risk and specific lipid/LDL goals reviewed. We discussed several lifestyle modifications today. Stephanie Alexander will continue to work on diet, exercise and weight loss efforts. We will check labs today. Orders and follow up as documented in patient record.   - Comprehensive metabolic panel - Lipid Panel With LDL/HDL Ratio  6. Depression screening Stephanie Alexander had a negative depression screening. Depression is commonly associated with obesity and often results in emotional eating behaviors. We will monitor this closely and work on CBT to help improve the non-hunger eating patterns. Referral to Psychology may be required if no improvement is seen as she continues in our clinic.  7. At risk for diabetes mellitus Stephanie Alexander was given approximately 30 minutes of diabetes education and counseling today. We discussed intensive lifestyle modifications today with an emphasis on weight loss as well as increasing exercise and decreasing simple carbohydrates in her diet. We also reviewed medication options with an emphasis on risk versus benefit of those discussed.   Repetitive spaced learning was employed today to elicit superior memory formation and behavioral change.  8. Class 1 obesity with serious comorbidity and body mass index (BMI) of 32.0 to 32.9 in adult, unspecified obesity type Stephanie Alexander is currently in the action  stage of change and her goal is to continue with weight loss efforts. I recommend Stephanie Alexander begin the structured treatment plan as follows:  She has agreed to the Category 2 Plan.  Exercise goals: No exercise has been prescribed for now, while we concentrate on nutritional changes.  Behavioral modification strategies: increasing lean protein intake, meal planning and cooking strategies and travel eating strategies.  She was informed of the importance of frequent follow-up visits to maximize her success with intensive lifestyle modifications for her multiple health conditions. She was informed we would discuss her lab results at her next visit unless there is a critical issue that needs to be addressed sooner. Stephanie Alexander agreed to keep her next visit at the agreed upon time to discuss these results.  Objective:   Blood pressure (!) 150/76, pulse (!) 45, temperature 98.1 F (36.7 C), temperature source Oral, height 5\' 4"  (1.626 m), weight 190 lb (86.2 kg), SpO2 96 %. Body mass index is 32.61 kg/m.  EKG: Normal sinus rhythm, rate 51 BPM.  Indirect Calorimeter completed today shows a VO2 of 275 and a REE of 1912.  Her calculated basal metabolic rate is 8469 thus her basal metabolic rate is better than expected.  General: Cooperative, alert, well developed, in no acute distress. HEENT: Conjunctivae and lids unremarkable. Cardiovascular: Regular rhythm.  Lungs: Normal work of breathing. Neurologic: No focal deficits.   Lab Results  Component Value Date   CREATININE 0.67 02/26/2020   BUN  14 02/26/2020   NA 139 02/26/2020   K 4.3 02/26/2020   CL 100 02/26/2020   CO2 25 02/26/2020   Lab Results  Component Value Date   ALT 62 (H) 02/26/2020   AST 35 02/26/2020   ALKPHOS 115 02/26/2020   BILITOT 1.3 (H) 02/26/2020   Lab Results  Component Value Date   HGBA1C 10.2 (H) 02/26/2020   Lab Results  Component Value Date   INSULIN 11.9 02/26/2020   Lab Results  Component Value Date     TSH 2.430 02/26/2020   Lab Results  Component Value Date   CHOL 173 02/26/2020   HDL 45 02/26/2020   LDLCALC 102 (H) 02/26/2020   TRIG 149 02/26/2020   Lab Results  Component Value Date   WBC 7.0 02/26/2020   HGB 14.7 02/26/2020   HCT 42.2 02/26/2020   MCV 90 02/26/2020   PLT 195 02/26/2020   No results found for: IRON, TIBC, FERRITIN  Attestation Statements:   Reviewed by clinician on day of visit: allergies, medications, problem list, medical history, surgical history, family history, social history, and previous encounter notes.   I, Trixie Dredge, am acting as transcriptionist for Dennard Nip, MD.  I have reviewed the above documentation for accuracy and completeness, and I agree with the above. - Dennard Nip, MD

## 2020-03-11 ENCOUNTER — Ambulatory Visit (INDEPENDENT_AMBULATORY_CARE_PROVIDER_SITE_OTHER): Payer: BC Managed Care – PPO | Admitting: Family Medicine

## 2020-03-11 ENCOUNTER — Other Ambulatory Visit: Payer: Self-pay

## 2020-03-11 ENCOUNTER — Encounter (INDEPENDENT_AMBULATORY_CARE_PROVIDER_SITE_OTHER): Payer: Self-pay | Admitting: Family Medicine

## 2020-03-11 VITALS — BP 120/76 | HR 62 | Temp 97.6°F | Ht 64.0 in | Wt 188.0 lb

## 2020-03-11 DIAGNOSIS — E559 Vitamin D deficiency, unspecified: Secondary | ICD-10-CM | POA: Diagnosis not present

## 2020-03-11 DIAGNOSIS — K760 Fatty (change of) liver, not elsewhere classified: Secondary | ICD-10-CM

## 2020-03-11 DIAGNOSIS — Z9189 Other specified personal risk factors, not elsewhere classified: Secondary | ICD-10-CM | POA: Diagnosis not present

## 2020-03-11 DIAGNOSIS — E1169 Type 2 diabetes mellitus with other specified complication: Secondary | ICD-10-CM

## 2020-03-11 DIAGNOSIS — E785 Hyperlipidemia, unspecified: Secondary | ICD-10-CM

## 2020-03-11 DIAGNOSIS — E669 Obesity, unspecified: Secondary | ICD-10-CM

## 2020-03-11 DIAGNOSIS — Z6832 Body mass index (BMI) 32.0-32.9, adult: Secondary | ICD-10-CM

## 2020-03-11 MED ORDER — VITAMIN D (ERGOCALCIFEROL) 1.25 MG (50000 UNIT) PO CAPS: 50000.0000 [IU] | ORAL_CAPSULE | ORAL | 0 refills | Status: DC

## 2020-03-11 MED ORDER — INSULIN PEN NEEDLE 32G X 4 MM MISC: 1.0000 | 0 refills | Status: AC

## 2020-03-11 MED ORDER — OZEMPIC (0.25 OR 0.5 MG/DOSE) 2 MG/1.5ML ~~LOC~~ SOPN: 0.2500 mg | PEN_INJECTOR | SUBCUTANEOUS | 0 refills | Status: DC

## 2020-03-11 NOTE — Progress Notes (Signed)
Chief Complaint:   OBESITY Stephanie Alexander is here to discuss her progress with her obesity treatment plan along with follow-up of her obesity related diagnoses. Stephanie Alexander is on the Category 2 Plan and states she is following her eating plan approximately 60% of the time. Stephanie Alexander states she is doing 0 minutes 0 times per week.  Today's visit was #: 2 Starting weight: 190 lbs Starting date: 02/26/2020 Today's weight: 188 lbs Today's date: 03/11/2020 Total lbs lost to date: 2 Total lbs lost since last in-office visit: 2  Interim History: Stephanie Alexander has done well with weight loss on her Category 2 plan. She went on vacation and still was able to avoid weight gain. She notes significant hunger especially in the afternoon.  Subjective:   1. NAFLD (nonalcoholic fatty liver disease) Stephanie Alexander's ALT is elevated, and she has a history of non-alcoholic fatty liver disease. She was diagnosed by ultrasound, but no biopsy. I discussed labs with the patient today.  2. Vitamin D deficiency Stephanie Alexander is not on Vit D and she notes fatigue. I discussed labs with the patient today.  3. Hyperlipidemia associated with type 2 diabetes mellitus (Stephanie Alexander) Stephanie Alexander's LDL is above goal. She is not on statin, and she attempting to improve with diet. I discussed labs with the patient today.  4. Type 2 diabetes mellitus with other specified complication, without long-term current use of insulin (HCC) Stephanie Alexander's A1c is uncontrolled, and she has a new diagnosis of diabetes mellitus. She notes polyphagia, and increased polydipsia and increased polyuria. She is on metformin for pre-diabetes. I discussed labs with the patient today.  5. At risk for dehydration Stephanie Alexander is at risk for dehydration due to uncontrolled diabetes mellitus and weight loss.  Assessment/Plan:   1. NAFLD (nonalcoholic fatty liver disease) We discussed the likely diagnosis of non-alcoholic fatty liver disease today and how this  condition is obesity related. Stephanie Alexander was educated the importance of weight loss. Stephanie Alexander agreed to continue with her weight loss efforts with healthier diet and exercise as an essential part of her treatment plan. We will recheck labs in 3 months.  2. Vitamin D deficiency Low Vitamin D level contributes to fatigue and are associated with obesity, breast, and colon cancer. Caleesi agreed to start prescription Vitamin D 50,000 IU every week with no refills. She will follow-up for routine testing of Vitamin D, at least 2-3 times per year to avoid over-replacement.  - Vitamin D, Ergocalciferol, (DRISDOL) 1.25 MG (50000 UNIT) CAPS capsule; Take 1 capsule (50,000 Units total) by mouth every 7 (seven) days.  Dispense: 4 capsule; Refill: 0  3. Hyperlipidemia associated with type 2 diabetes mellitus (Stephanie Alexander) Cardiovascular risk and specific lipid/LDL goals reviewed. We discussed several lifestyle modifications today. Debbra will continue to work on diet, exercise and weight loss efforts. We will recheck labs in 3 months, may need to start a statin. Orders and follow up as documented in patient record.   4. Type 2 diabetes mellitus with other specified complication, without long-term current use of insulin (HCC) Stephanie Alexander control is important to decrease the likelihood of diabetic complications such as nephropathy, neuropathy, limb loss, blindness, coronary artery disease, and death. Intensive lifestyle modification including diet, exercise and weight loss are the first line of treatment for diabetes. Stephanie Alexander agreed to start Ozempic 0.25 mg q weekly with no refills. She will continue metformin, no need to check her BGs yet and will discuss at her next visit. We will recheck labs in 3 months.  -  Semaglutide,0.25 or 0.5MG /DOS, (OZEMPIC, 0.25 OR 0.5 MG/DOSE,) 2 MG/1.5ML SOPN; Inject 0.1875 mLs (0.25 mg total) into the skin once a week.  Dispense: 1.5 mL; Refill: 0 - Insulin Pen Needle 32G X 4  MM MISC; 1 each by Does not apply route once a week.  Dispense: 50 each; Refill: 0  5. At risk for dehydration Stephanie Alexander was given approximately 30 minutes dehydration prevention counseling today. Stephanie Alexander is at risk for dehydration due to weight loss and current medication(s). She was encouraged to hydrate and monitor fluid status to avoid dehydration as well as weight loss plateaus.   6. Class 1 obesity with serious comorbidity and body mass index (BMI) of 32.0 to 32.9 in adult, unspecified obesity type Stephanie Alexander is currently in the action stage of change. As such, her goal is to continue with weight loss efforts. She has agreed to the Category 2 Plan.   Behavioral modification strategies: increasing lean protein intake, decreasing simple carbohydrates and increasing water intake.  Stephanie Alexander has agreed to follow-up with our clinic in 2 weeks. She was informed of the importance of frequent follow-up visits to maximize her success with intensive lifestyle modifications for her multiple health conditions.   Objective:   Blood pressure 120/76, pulse 62, temperature 97.6 F (36.4 C), temperature source Oral, height 5\' 4"  (1.626 m), weight 188 lb (85.3 kg), SpO2 97 %. Body mass index is 32.27 kg/m.  General: Cooperative, alert, well developed, in no acute distress. HEENT: Conjunctivae and lids unremarkable. Cardiovascular: Regular rhythm.  Lungs: Normal work of breathing. Neurologic: No focal deficits.   Lab Results  Component Value Date   CREATININE 0.67 02/26/2020   BUN 14 02/26/2020   NA 139 02/26/2020   K 4.3 02/26/2020   CL 100 02/26/2020   CO2 25 02/26/2020   Lab Results  Component Value Date   ALT 62 (H) 02/26/2020   AST 35 02/26/2020   ALKPHOS 115 02/26/2020   BILITOT 1.3 (H) 02/26/2020   Lab Results  Component Value Date   HGBA1C 10.2 (H) 02/26/2020   Lab Results  Component Value Date   INSULIN 11.9 02/26/2020   Lab Results  Component Value Date   TSH  2.430 02/26/2020   Lab Results  Component Value Date   CHOL 173 02/26/2020   HDL 45 02/26/2020   LDLCALC 102 (H) 02/26/2020   TRIG 149 02/26/2020   Lab Results  Component Value Date   WBC 7.0 02/26/2020   HGB 14.7 02/26/2020   HCT 42.2 02/26/2020   MCV 90 02/26/2020   PLT 195 02/26/2020   No results found for: IRON, TIBC, FERRITIN  Attestation Statements:   Reviewed by clinician on day of visit: allergies, medications, problem list, medical history, surgical history, family history, social history, and previous encounter notes.   I, Trixie Dredge, am acting as transcriptionist for Dennard Nip, MD.  I have reviewed the above documentation for accuracy and completeness, and I agree with the above. -  Dennard Nip, MD

## 2020-03-30 ENCOUNTER — Ambulatory Visit (INDEPENDENT_AMBULATORY_CARE_PROVIDER_SITE_OTHER): Payer: BC Managed Care – PPO | Admitting: Family Medicine

## 2020-04-01 ENCOUNTER — Other Ambulatory Visit (INDEPENDENT_AMBULATORY_CARE_PROVIDER_SITE_OTHER): Payer: Self-pay | Admitting: Family Medicine

## 2020-04-01 DIAGNOSIS — E559 Vitamin D deficiency, unspecified: Secondary | ICD-10-CM

## 2020-04-13 ENCOUNTER — Ambulatory Visit (INDEPENDENT_AMBULATORY_CARE_PROVIDER_SITE_OTHER): Payer: BC Managed Care – PPO | Admitting: Family Medicine

## 2020-04-13 ENCOUNTER — Other Ambulatory Visit: Payer: Self-pay

## 2020-04-13 ENCOUNTER — Encounter (INDEPENDENT_AMBULATORY_CARE_PROVIDER_SITE_OTHER): Payer: Self-pay | Admitting: Family Medicine

## 2020-04-13 VITALS — BP 98/63 | HR 61 | Temp 98.3°F | Ht 64.0 in | Wt 187.0 lb

## 2020-04-13 DIAGNOSIS — E119 Type 2 diabetes mellitus without complications: Secondary | ICD-10-CM | POA: Insufficient documentation

## 2020-04-13 DIAGNOSIS — E669 Obesity, unspecified: Secondary | ICD-10-CM | POA: Diagnosis not present

## 2020-04-13 DIAGNOSIS — Z9189 Other specified personal risk factors, not elsewhere classified: Secondary | ICD-10-CM | POA: Diagnosis not present

## 2020-04-13 DIAGNOSIS — E1169 Type 2 diabetes mellitus with other specified complication: Secondary | ICD-10-CM

## 2020-04-13 DIAGNOSIS — Z6832 Body mass index (BMI) 32.0-32.9, adult: Secondary | ICD-10-CM | POA: Diagnosis not present

## 2020-04-13 DIAGNOSIS — E559 Vitamin D deficiency, unspecified: Secondary | ICD-10-CM | POA: Insufficient documentation

## 2020-04-13 MED ORDER — VITAMIN D (ERGOCALCIFEROL) 1.25 MG (50000 UNIT) PO CAPS: 50000.0000 [IU] | ORAL_CAPSULE | ORAL | 1 refills | Status: DC

## 2020-04-13 MED ORDER — OZEMPIC (0.25 OR 0.5 MG/DOSE) 2 MG/1.5ML ~~LOC~~ SOPN: 0.5000 mg | PEN_INJECTOR | SUBCUTANEOUS | 0 refills | Status: DC

## 2020-04-13 NOTE — Progress Notes (Signed)
Chief Complaint:   OBESITY Stephanie Alexander is here to discuss her progress with her obesity treatment plan along with follow-up of her obesity related diagnoses. Stephanie Alexander is on the Category 2 Plan and states she is following her eating plan approximately 50% of the time. Stephanie Alexander states she is doing 0 minutes 0 times per week.  Today's visit was #: 3 Starting weight: 190 lbs Starting date: 02/26/2020 Today's weight: 187 lbs Today's date: 04/13/2020 Total lbs lost to date: 3 Total lbs lost since last in-office visit: 1  Interim History: Stephanie Alexander continues to lose weight but she has had challenges with the weekends with traveling and eating out. She did her best to be mindful but thinks  Her traveling is decreasing which will make following her plan more closely easier.   Subjective:   1. Type 2 diabetes mellitus with other specified complication, without long-term current use of insulin ([place]) Stephanie Alexander started [place] and is doing well. She denies nausea, vomiting, or hypoglycemia. Her polyphagia has improved but she is on a subtherapeutic dose.  2. Vitamin D deficiency Stephanie Alexander is stable on Vit D, and she denies nausea or vomiting. Her level is not yet at goal.  3. At risk for complication associated with hypotension The patient is at a higher than average risk of hypotension due to weight loss and low blood pressure.  Assessment/Plan:   1. Type 2 diabetes mellitus with other specified complication, without long-term current use of insulin (HCC) Good blood sugar control is important to decrease the likelihood of diabetic complications such as nephropathy, neuropathy, limb loss, blindness, coronary artery disease, and death. Intensive lifestyle modification including diet, exercise and weight loss are the first line of treatment for diabetes. Stephanie Alexander agreed to increase Ozempic to 0.50 mg SubQ weekly, and we will refill for 1 month.  - Semaglutide,0.25 or 0.5MG /DOS,  (OZEMPIC, 0.25 OR 0.5 MG/DOSE,) 2 MG/1.5ML SOPN; Inject 0.5 mg into the skin once a week.  Dispense: 3 mL; Refill: 0  2. Vitamin D deficiency Low Vitamin D level contributes to fatigue and are associated with obesity, breast, and colon cancer. We will refill prescription Vitamin D for 1 month. Stephanie Alexander will follow-up for routine testing of Vitamin D, at least 2-3 times per year to avoid over-replacement.  - Vitamin D, Ergocalciferol, (DRISDOL) 1.25 MG (50000 UNIT) CAPS capsule; Take 1 capsule (50,000 Units total) by mouth every 7 (seven) days.  Dispense: 12 capsule; Refill: 1  3. At risk for complication associated with hypotension Stephanie Alexander was given approximately 15 minutes of education and counseling today to help avoid hypotension. We discussed risks of hypotension with weight loss and signs of hypotension such as feeling lightheaded or unsteady.  Repetitive spaced learning was employed today to elicit superior memory formation and behavioral change.  4. Class 1 obesity with serious comorbidity and body mass index (BMI) of 32.0 to 32.9 in adult, unspecified obesity type Stephanie Alexander is currently in the action stage of change. As such, her goal is to continue with weight loss efforts. She has agreed to the Category 2 Plan.   Exercise goals: No exercise has been prescribed at this time.  Behavioral modification strategies: increasing lean protein intake.  Stephanie Alexander has agreed to follow-up with our clinic in 2 weeks with Stephanie Potash, PA-C or Stephanie Rogue, Stephanie Alexander. She was informed of the importance of frequent follow-up visits to maximize her success with intensive lifestyle modifications for her multiple health conditions.   Objective:   Blood pressure 98/63, pulse 61,  temperature 98.3 F (36.8 C), height 5\' 4"  (1.626 m), weight 187 lb (84.8 kg), SpO2 97 %. Body mass index is 32.1 kg/m.  General: Cooperative, alert, well developed, in no acute distress. HEENT: Conjunctivae and lids  unremarkable. Cardiovascular: Regular rhythm.  Lungs: Normal work of breathing. Neurologic: No focal deficits.   Lab Results  Component Value Date   CREATININE 0.67 02/26/2020   BUN 14 02/26/2020   NA 139 02/26/2020   K 4.3 02/26/2020   CL 100 02/26/2020   CO2 25 02/26/2020   Lab Results  Component Value Date   ALT 62 (H) 02/26/2020   AST 35 02/26/2020   ALKPHOS 115 02/26/2020   BILITOT 1.3 (H) 02/26/2020   Lab Results  Component Value Date   HGBA1C 10.2 (H) 02/26/2020   Lab Results  Component Value Date   INSULIN 11.9 02/26/2020   Lab Results  Component Value Date   TSH 2.430 02/26/2020   Lab Results  Component Value Date   CHOL 173 02/26/2020   HDL 45 02/26/2020   LDLCALC 102 (H) 02/26/2020   TRIG 149 02/26/2020   Lab Results  Component Value Date   WBC 7.0 02/26/2020   HGB 14.7 02/26/2020   HCT 42.2 02/26/2020   MCV 90 02/26/2020   PLT 195 02/26/2020   No results found for: IRON, TIBC, FERRITIN  Attestation Statements:   Reviewed by clinician on day of visit: allergies, medications, problem list, medical history, surgical history, family history, social history, and previous encounter notes.   I, Trixie Dredge, am acting as transcriptionist for Dennard Nip, MD.  I have reviewed the above documentation for accuracy and completeness, and I agree with the above. -  Dennard Nip, MD

## 2020-04-27 ENCOUNTER — Ambulatory Visit (INDEPENDENT_AMBULATORY_CARE_PROVIDER_SITE_OTHER): Payer: BC Managed Care – PPO | Admitting: Adult Health

## 2020-04-27 ENCOUNTER — Encounter (INDEPENDENT_AMBULATORY_CARE_PROVIDER_SITE_OTHER): Payer: Self-pay | Admitting: Adult Health

## 2020-04-27 ENCOUNTER — Other Ambulatory Visit: Payer: Self-pay

## 2020-04-27 VITALS — BP 123/75 | HR 62 | Temp 97.7°F | Ht 64.0 in | Wt 188.0 lb

## 2020-04-27 DIAGNOSIS — E1159 Type 2 diabetes mellitus with other circulatory complications: Secondary | ICD-10-CM | POA: Diagnosis not present

## 2020-04-27 DIAGNOSIS — Z9189 Other specified personal risk factors, not elsewhere classified: Secondary | ICD-10-CM | POA: Diagnosis not present

## 2020-04-27 DIAGNOSIS — Z6832 Body mass index (BMI) 32.0-32.9, adult: Secondary | ICD-10-CM

## 2020-04-27 DIAGNOSIS — E1169 Type 2 diabetes mellitus with other specified complication: Secondary | ICD-10-CM

## 2020-04-27 DIAGNOSIS — I152 Hypertension secondary to endocrine disorders: Secondary | ICD-10-CM

## 2020-04-27 DIAGNOSIS — K5909 Other constipation: Secondary | ICD-10-CM

## 2020-04-27 DIAGNOSIS — E669 Obesity, unspecified: Secondary | ICD-10-CM

## 2020-04-27 DIAGNOSIS — E559 Vitamin D deficiency, unspecified: Secondary | ICD-10-CM | POA: Diagnosis not present

## 2020-04-27 NOTE — Progress Notes (Signed)
Chief Complaint:   OBESITY Stephanie Alexander is here to discuss her progress with her obesity treatment plan along with follow-up of her obesity related diagnoses. Stephanie Alexander is on the Category 2 Plan and states she is following her eating plan approximately 60% of the time. Stephanie Alexander states she is exercising 0 minutes 0 times per week.  Today's visit was #: 4 Starting weight: 190 lbs Starting date: 02/26/2020 Today's weight: 188 lbs Today's date: 04/27/2020 Total lbs lost to date: 2 Total lbs lost since last in-office visit: 0  Interim History: Stephanie Alexander finds that breakfast/lunch are fairly easy to follow, then dinner can be a challenge to stay on track. She estimates to drink ~40 oz of water a day. She denies polyphagia and she has been trying to increase protein at each meal.  Subjective:   Type 2 diabetes mellitus with other specified complication, without long-term current use of insulin (Stephanie Alexander). 02/26/2020 blood glucose 243, A1c 10.2 with an insulin level of 11.9. Stephanie Alexander is on metformin 500 mg BID. She was started on Ozempic 0.25 mg once weekly on 03/11/2020 and will increase Ozempic to 0.5 mg this Friday, 04/30/2020. She has been experiencing constipation the last 4 weeks. She has not been checking her blood glucose levels at home as she does not have a meter.  Lab Results  Component Value Date   HGBA1C 10.2 (H) 02/26/2020   Lab Results  Component Value Date   LDLCALC 102 (H) 02/26/2020   CREATININE 0.67 02/26/2020   Lab Results  Component Value Date   INSULIN 11.9 02/26/2020   Vitamin D deficiency. Vitamin D level on 02/26/2020 as 32.9, which is below goal of 50. Stephanie Alexander is on Ergocalciferol. No nausea, vomiting, or muscle weakness.    Ref. Range 02/26/2020 14:14  Vitamin D, 25-Hydroxy Latest Ref Range: 30.0 - 100.0 ng/mL 32.9   Hypertension associated with diabetes ([name]). Blood pressure and heart rate are excellent at today's office visit. Stephanie Alexander is on  metoprolol succinate 25 mg daily and losartan 100 mg daily.  BP Readings from Last 3 Encounters:  04/27/20 123/75  04/13/20 98/63  03/11/20 120/76   Lab Results  Component Value Date   CREATININE 0.67 02/26/2020   Other constipation. Stephanie Alexander reports worsening constipation the last 4 weeks. She was started on Ozempic 03/11/2020. She will experience abdominal pain/bloating just prior to a bowel movement that will resolve after bowels move. She had a large bowel movement last night. Last colonoscopy was with [facility] in 2016, which she reports was a normal study and will need to be repeated in 5 years. She denies first degree family history of colon cancer.  At risk for hyperglycemia. Stephanie Alexander is at risk for hyperglycemia due to uncontrolled type II diabetes and obesity.  Assessment/Plan:   Type 2 diabetes mellitus with other specified complication, without long-term current use of insulin ([name]). Good blood sugar control is important to decrease the likelihood of diabetic complications such as nephropathy, neuropathy, limb loss, blindness, coronary artery disease, and death. Intensive lifestyle modification including diet, exercise and weight loss are the first line of treatment for diabetes. [name] will continue metformin 500 mg BID and will increase Ozempic as directed. She will increase water and fiber in her diet and use OTC Miralax. She will call her insurance company and inquire which glucometer is covered.  Vitamin D deficiency. Low Vitamin D level contributes to fatigue and are associated with obesity, breast, and colon cancer. She agrees to continue to take  Ergocalciferol as directed and will follow-up for routine testing of Vitamin D every 3 months.   Hypertension associated with diabetes ([name]). Stephanie Alexander is working on healthy weight loss and exercise to improve blood pressure control. We will watch for signs of hypotension as she continues her lifestyle modifications. She will  continue her current anti-hypertensive therapy as directed.   Other constipation. Stephanie Alexander was informed that a decrease in bowel movement frequency is normal while losing weight, but stools should not be hard or painful. Orders and follow up as documented in patient record. She will increase water and fiber in her diet, use OTC Miralax, and increase daily walking.  Counseling Getting to Good Bowel Health: Your goal is to have one soft bowel movement each day. Drink at least 8 glasses of water each day. Eat plenty of fiber (goal is over 25 grams each day). It is best to get most of your fiber from dietary sources which includes leafy green vegetables, fresh fruit, and whole grains. You may need to add fiber with the help of OTC fiber supplements. These include Metamucil, Citrucel, and Flaxseed. If you are still having trouble, try adding Miralax or Magnesium Citrate. If all of these changes do not work, Cabin [employer].  At risk for hyperglycemia. Stephanie Alexander was given approximately 15 minutes of counseling today regarding prevention of hyperglycemia. She was advised of hyperglycemia causes and the fact hyperglycemia is often asymptomatic. Stephanie Alexander was instructed to avoid skipping meals, eat regular protein rich meals and schedule low calorie but protein rich snacks as needed.   Repetitive spaced learning was employed today to elicit superior memory formation and behavioral change.  Class 1 obesity with serious comorbidity and body mass index (BMI) of 32.0 to 32.9 in adult, unspecified obesity type.  Stephanie Alexander is currently in the action stage of change. As such, her goal is to continue with weight loss efforts. She has agreed to the Category 2 Plan.   Handout was provided on [employer].  Exercise goals: No exercise has been prescribed at this time.  Behavioral modification strategies: increasing lean protein intake, decreasing simple carbohydrates, increasing water intake,  increasing high fiber foods and planning for success.  Stephanie Alexander has agreed to follow-up with our clinic in 2 weeks. She was informed of the importance of frequent follow-up visits to maximize her success with intensive lifestyle modifications for her multiple health conditions.   Objective:   Blood pressure 123/75, pulse 62, temperature 97.7 F (36.5 C), height 5\' 4"  (1.626 m), weight 188 lb (85.3 kg), SpO2 96 %. Body mass index is 32.27 kg/m.  General: Cooperative, alert, well developed, in no acute distress. HEENT: Conjunctivae and lids unremarkable. Cardiovascular: Regular rhythm.  Lungs: Normal work of breathing. Neurologic: No focal deficits.   Lab Results  Component Value Date   CREATININE 0.67 02/26/2020   BUN 14 02/26/2020   NA 139 02/26/2020   K 4.3 02/26/2020   CL 100 02/26/2020   CO2 25 02/26/2020   Lab Results  Component Value Date   ALT 62 (H) 02/26/2020   AST 35 02/26/2020   ALKPHOS 115 02/26/2020   BILITOT 1.3 (H) 02/26/2020   Lab Results  Component Value Date   HGBA1C 10.2 (H) 02/26/2020   Lab Results  Component Value Date   INSULIN 11.9 02/26/2020   Lab Results  Component Value Date   TSH 2.430 02/26/2020   Lab Results  Component Value Date   CHOL 173 02/26/2020   HDL 45 02/26/2020  LDLCALC 102 (H) 02/26/2020   TRIG 149 02/26/2020   Lab Results  Component Value Date   WBC 7.0 02/26/2020   HGB 14.7 02/26/2020   HCT 42.2 02/26/2020   MCV 90 02/26/2020   PLT 195 02/26/2020   No results found for: IRON, TIBC, FERRITIN  Attestation Statements:   Reviewed by clinician on day of visit: allergies, medications, problem list, medical history, surgical history, family history, social history, and previous encounter notes.  I, Michaelene Song, am acting as Location manager for [employer], NP-C   I have reviewed the above documentation for accuracy and completeness, and I agree with the above. -  Jeania Nater d. Lamount Bankson, NP-C

## 2020-05-11 ENCOUNTER — Encounter (INDEPENDENT_AMBULATORY_CARE_PROVIDER_SITE_OTHER): Payer: Self-pay | Admitting: Family Medicine

## 2020-05-11 ENCOUNTER — Other Ambulatory Visit: Payer: Self-pay

## 2020-05-11 ENCOUNTER — Ambulatory Visit (INDEPENDENT_AMBULATORY_CARE_PROVIDER_SITE_OTHER): Payer: BC Managed Care – PPO | Admitting: Family Medicine

## 2020-05-11 VITALS — BP 114/72 | HR 82 | Temp 98.1°F | Ht 64.0 in | Wt 186.0 lb

## 2020-05-11 DIAGNOSIS — E669 Obesity, unspecified: Secondary | ICD-10-CM

## 2020-05-11 DIAGNOSIS — E559 Vitamin D deficiency, unspecified: Secondary | ICD-10-CM

## 2020-05-11 DIAGNOSIS — Z9189 Other specified personal risk factors, not elsewhere classified: Secondary | ICD-10-CM

## 2020-05-11 DIAGNOSIS — E1169 Type 2 diabetes mellitus with other specified complication: Secondary | ICD-10-CM | POA: Diagnosis not present

## 2020-05-11 DIAGNOSIS — Z6832 Body mass index (BMI) 32.0-32.9, adult: Secondary | ICD-10-CM | POA: Diagnosis not present

## 2020-05-11 MED ORDER — OZEMPIC (1 MG/DOSE) 2 MG/1.5ML ~~LOC~~ SOPN: 1.0000 mg | PEN_INJECTOR | SUBCUTANEOUS | 0 refills | Status: DC

## 2020-05-11 MED ORDER — VITAMIN D (ERGOCALCIFEROL) 1.25 MG (50000 UNIT) PO CAPS: 50000.0000 [IU] | ORAL_CAPSULE | ORAL | 0 refills | Status: DC

## 2020-05-12 NOTE — Progress Notes (Signed)
Chief Complaint:   OBESITY Stephanie Alexander is here to discuss her progress with her obesity treatment plan along with follow-up of her obesity related diagnoses. Stephanie Alexander is on the Category 2 Plan and states she is following her eating plan approximately 60% of the time. Stephanie Alexander states she is walking the dog for 15 minutes 4 times per week.  Today's visit was #: 5 Starting weight: 190 lbs Starting date: 02/26/2020 Today's weight: 186 lbs Today's date: 05/11/2020 Total lbs lost to date: 4 Total lbs lost since last in-office visit: 2  Interim History: Stephanie Alexander is still experiencing constipation with Ozempic. Occasionally putting peanut butter on apple and mayo on sandwich. Snack calories are [employer] sugar free candies, skinny cow ice cream or an apple. She is not always getting meat in at dinner. She denies hunger.  Subjective:   1. Vitamin D deficiency Stephanie Alexander denies nausea, vomiting, or muscle weakness, but notes fatigue. Last vit D level was 32.9.  2. Type 2 diabetes mellitus with other specified complication, without long-term current use of insulin (HCC) Stephanie Alexander denies hypoglycemia, but notes GI side effects of constipation. Last A1c was 10.2 and insulin 11.9.  3. At risk for osteoporosis Stephanie Alexander is at higher risk of osteopenia and osteoporosis due to Vitamin D deficiency.   Assessment/Plan:   1. Vitamin D deficiency Low Vitamin D level contributes to fatigue and are associated with obesity, breast, and colon cancer. We will refill prescription Vitamin D for 1 month. Stephanie Alexander will follow-up for routine testing of Vitamin D, at least 2-3 times per year to avoid over-replacement.  - Vitamin D, Ergocalciferol, (DRISDOL) 1.25 MG (50000 UNIT) CAPS capsule; Take 1 capsule (50,000 Units total) by mouth every 7 (seven) days.  Dispense: 4 capsule; Refill: 0  2. Type 2 diabetes mellitus with other specified complication, without long-term current use of insulin  (HCC) Good blood sugar control is important to decrease the likelihood of diabetic complications such as nephropathy, neuropathy, limb loss, blindness, coronary artery disease, and death. Intensive lifestyle modification including diet, exercise and weight loss are the first line of treatment for diabetes. Stephanie Alexander agreed to increase Ozempic to 1 mg SubQ weekly with no refills.  - Semaglutide, 1 MG/DOSE, (OZEMPIC, 1 MG/DOSE,) 2 MG/1.5ML SOPN; Inject 1 mg into the skin once a week.  Dispense: 3 mL; Refill: 0  3. At risk for osteoporosis Stephanie Alexander was given approximately 15 minutes of osteoporosis prevention counseling today. Stephanie Alexander is at risk for osteopenia and osteoporosis due to her Vitamin D deficiency. She was encouraged to take her Vitamin D and follow her higher calcium diet and increase strengthening exercise to help strengthen her bones and decrease her risk of osteopenia and osteoporosis.  Repetitive spaced learning was employed today to elicit superior memory formation and behavioral change.  4. Class 1 obesity with serious comorbidity and body mass index (BMI) of 32.0 to 32.9 in adult, unspecified obesity type Stephanie Alexander is currently in the action stage of change. As such, her goal is to continue with weight loss efforts. She has agreed to the Category 2 Plan or the [place].   Exercise goals: As is.  Behavioral modification strategies: increasing lean protein intake, meal planning and cooking strategies and better snacking choices.  Stephanie Alexander has agreed to follow-up with our clinic in 2 weeks. She was informed of the importance of frequent follow-up visits to maximize her success with intensive lifestyle modifications for her multiple health conditions.   Objective:   Blood pressure 114/72, pulse  82, temperature 98.1 F (36.7 C), temperature source Oral, height 5\' 4"  (1.626 m), weight 186 lb (84.4 kg), SpO2 96 %. Body mass index is 31.93 kg/m.  General:  Cooperative, alert, well developed, in no acute distress. HEENT: Conjunctivae and lids unremarkable. Cardiovascular: Regular rhythm.  Lungs: Normal work of breathing. Neurologic: No focal deficits.   Lab Results  Component Value Date   CREATININE 0.67 02/26/2020   BUN 14 02/26/2020   NA 139 02/26/2020   K 4.3 02/26/2020   CL 100 02/26/2020   CO2 25 02/26/2020   Lab Results  Component Value Date   ALT 62 (H) 02/26/2020   AST 35 02/26/2020   ALKPHOS 115 02/26/2020   BILITOT 1.3 (H) 02/26/2020   Lab Results  Component Value Date   HGBA1C 10.2 (H) 02/26/2020   Lab Results  Component Value Date   INSULIN 11.9 02/26/2020   Lab Results  Component Value Date   TSH 2.430 02/26/2020   Lab Results  Component Value Date   CHOL 173 02/26/2020   HDL 45 02/26/2020   LDLCALC 102 (H) 02/26/2020   TRIG 149 02/26/2020   Lab Results  Component Value Date   WBC 7.0 02/26/2020   HGB 14.7 02/26/2020   HCT 42.2 02/26/2020   MCV 90 02/26/2020   PLT 195 02/26/2020   No results found for: IRON, TIBC, FERRITIN  Attestation Statements:   Reviewed by clinician on day of visit: allergies, medications, problem list, medical history, surgical history, family history, social history, and previous encounter notes.   I, Trixie Dredge, am acting as transcriptionist for Coralie Common, MD.  I have reviewed the above documentation for accuracy and completeness, and I agree with the above. - Jinny Blossom, MD

## 2020-05-25 ENCOUNTER — Ambulatory Visit (INDEPENDENT_AMBULATORY_CARE_PROVIDER_SITE_OTHER): Payer: BC Managed Care – PPO | Admitting: Family Medicine

## 2020-06-07 ENCOUNTER — Other Ambulatory Visit (INDEPENDENT_AMBULATORY_CARE_PROVIDER_SITE_OTHER): Payer: Self-pay

## 2020-06-07 ENCOUNTER — Other Ambulatory Visit (INDEPENDENT_AMBULATORY_CARE_PROVIDER_SITE_OTHER): Payer: Self-pay | Admitting: Family Medicine

## 2020-06-07 DIAGNOSIS — E1169 Type 2 diabetes mellitus with other specified complication: Secondary | ICD-10-CM

## 2020-06-07 MED ORDER — OZEMPIC (1 MG/DOSE) 4 MG/3ML ~~LOC~~ SOPN: PEN_INJECTOR | SUBCUTANEOUS | 0 refills | Status: DC

## 2020-06-07 NOTE — Telephone Encounter (Signed)
Pt called stating that she has left her Ozempic in the fridge when she went to the beach, so she needs a refill & that she was told to to up it to 1 MG when she was last seen by Dr.U.

## 2020-06-07 NOTE — Telephone Encounter (Signed)
Approve by Dr. Janalyn Harder

## 2020-06-07 NOTE — Telephone Encounter (Signed)
Please approve if rx ok to refill

## 2020-06-07 NOTE — Telephone Encounter (Signed)
This patient was last seen by Dr. Jearld Shines, and currently has an upcoming appt scheduled on 06/15/20 with Dr. Leafy Ro.

## 2020-06-08 ENCOUNTER — Ambulatory Visit (INDEPENDENT_AMBULATORY_CARE_PROVIDER_SITE_OTHER): Payer: BC Managed Care – PPO | Admitting: Family Medicine

## 2020-06-15 ENCOUNTER — Other Ambulatory Visit: Payer: Self-pay

## 2020-06-15 ENCOUNTER — Ambulatory Visit (INDEPENDENT_AMBULATORY_CARE_PROVIDER_SITE_OTHER): Payer: BC Managed Care – PPO | Admitting: Family Medicine

## 2020-06-15 ENCOUNTER — Encounter (INDEPENDENT_AMBULATORY_CARE_PROVIDER_SITE_OTHER): Payer: Self-pay | Admitting: Family Medicine

## 2020-06-15 VITALS — BP 120/72 | HR 61 | Temp 97.8°F | Ht 64.0 in | Wt 185.0 lb

## 2020-06-15 DIAGNOSIS — Z6831 Body mass index (BMI) 31.0-31.9, adult: Secondary | ICD-10-CM | POA: Diagnosis not present

## 2020-06-15 DIAGNOSIS — E1169 Type 2 diabetes mellitus with other specified complication: Secondary | ICD-10-CM

## 2020-06-15 DIAGNOSIS — E669 Obesity, unspecified: Secondary | ICD-10-CM

## 2020-06-21 NOTE — Progress Notes (Signed)
Chief Complaint:   OBESITY Stephanie Alexander is here to discuss her progress with her obesity treatment plan along with follow-up of her obesity related diagnoses. Stephanie Alexander is on the Category 2 Plan or the [place] and states she is following her eating plan approximately 60% of the time. Stephanie Alexander states she is doing 0 minutes 0 times per week.  Today's visit was #: 6 Starting weight: 190 lbs Starting date: 02/26/2020 Today's weight: 185 lbs Today's date: 06/15/2020 Total lbs lost to date: 5 Total lbs lost since last in-office visit: 1  Interim History: Stephanie Alexander continues to do well with weight loss even over Thanksgiving. She thinks she lost more before Thanksgiving but has still done well overall. Her hunger is controlled for the most part.  Subjective:   1. Type 2 diabetes mellitus with other specified complication, without long-term current use of insulin (HCC) Stephanie Alexander did not bring her BGs log today. She is tolerating Ozempic well and just started on the 1 mg dose. She denies nausea, vomiting, or hypoglycemia.  Assessment/Plan:   1. Type 2 diabetes mellitus with other specified complication, without long-term current use of insulin (HCC) Good blood sugar control is important to decrease the likelihood of diabetic complications such as nephropathy, neuropathy, limb loss, blindness, coronary artery disease, and death. Intensive lifestyle modification including diet, exercise and weight loss are the first line of treatment for diabetes. Stephanie Alexander will continue metformin and Ozempic, and will continue diet and start to walking exercise soon.  2. Class 1 obesity with serious comorbidity and body mass index (BMI) of 31.0 to 31.9 in adult, unspecified obesity type Stephanie Alexander is currently in the action stage of change. As such, her goal is to continue with weight loss efforts. She has agreed to the Category 2 Plan.   Behavioral modification strategies: increasing lean  protein intake.  Stephanie Alexander has agreed to follow-up with our clinic in 2 to 3 weeks. She was informed of the importance of frequent follow-up visits to maximize her success with intensive lifestyle modifications for her multiple health conditions.   Objective:   Blood pressure 120/72, pulse 61, temperature 97.8 F (36.6 C), height 5\' 4"  (1.626 m), weight 185 lb (83.9 kg), SpO2 97 %. Body mass index is 31.76 kg/m.  General: Cooperative, alert, well developed, in no acute distress. HEENT: Conjunctivae and lids unremarkable. Cardiovascular: Regular rhythm.  Lungs: Normal work of breathing. Neurologic: No focal deficits.   Lab Results  Component Value Date   CREATININE 0.67 02/26/2020   BUN 14 02/26/2020   NA 139 02/26/2020   K 4.3 02/26/2020   CL 100 02/26/2020   CO2 25 02/26/2020   Lab Results  Component Value Date   ALT 62 (H) 02/26/2020   AST 35 02/26/2020   ALKPHOS 115 02/26/2020   BILITOT 1.3 (H) 02/26/2020   Lab Results  Component Value Date   HGBA1C 10.2 (H) 02/26/2020   Lab Results  Component Value Date   INSULIN 11.9 02/26/2020   Lab Results  Component Value Date   TSH 2.430 02/26/2020   Lab Results  Component Value Date   CHOL 173 02/26/2020   HDL 45 02/26/2020   LDLCALC 102 (H) 02/26/2020   TRIG 149 02/26/2020   Lab Results  Component Value Date   WBC 7.0 02/26/2020   HGB 14.7 02/26/2020   HCT 42.2 02/26/2020   MCV 90 02/26/2020   PLT 195 02/26/2020   No results found for: IRON, TIBC, FERRITIN  Attestation Statements:  Reviewed by clinician on day of visit: allergies, medications, problem list, medical history, surgical history, family history, social history, and previous encounter notes.  Time spent on visit including pre-visit chart review and post-visit care and charting was 20 minutes.    I, Trixie Dredge, am acting as transcriptionist for Dennard Nip, MD.  I have reviewed the above documentation for accuracy and completeness, and  I agree with the above. -  Dennard Nip, MD

## 2020-07-05 ENCOUNTER — Ambulatory Visit (INDEPENDENT_AMBULATORY_CARE_PROVIDER_SITE_OTHER): Payer: BC Managed Care – PPO | Admitting: Family Medicine

## 2020-07-05 ENCOUNTER — Encounter (INDEPENDENT_AMBULATORY_CARE_PROVIDER_SITE_OTHER): Payer: Self-pay | Admitting: Family Medicine

## 2020-07-05 ENCOUNTER — Other Ambulatory Visit: Payer: Self-pay

## 2020-07-05 VITALS — BP 121/67 | HR 61 | Temp 97.7°F | Ht 64.0 in | Wt 186.0 lb

## 2020-07-05 DIAGNOSIS — Z6832 Body mass index (BMI) 32.0-32.9, adult: Secondary | ICD-10-CM

## 2020-07-05 DIAGNOSIS — E669 Obesity, unspecified: Secondary | ICD-10-CM

## 2020-07-05 DIAGNOSIS — Z9189 Other specified personal risk factors, not elsewhere classified: Secondary | ICD-10-CM | POA: Diagnosis not present

## 2020-07-05 DIAGNOSIS — E559 Vitamin D deficiency, unspecified: Secondary | ICD-10-CM

## 2020-07-05 DIAGNOSIS — E1169 Type 2 diabetes mellitus with other specified complication: Secondary | ICD-10-CM

## 2020-07-05 MED ORDER — ONETOUCH VERIO IQ SYSTEM W/DEVICE KIT: PACK | 0 refills | Status: AC

## 2020-07-05 MED ORDER — ONETOUCH DELICA LANCETS 30G MISC: 0 refills | Status: AC

## 2020-07-05 MED ORDER — ONETOUCH VERIO VI STRP
ORAL_STRIP | 0 refills | Status: DC
Start: 2020-07-05 — End: 2020-08-16

## 2020-07-05 MED ORDER — VITAMIN D (ERGOCALCIFEROL) 1.25 MG (50000 UNIT) PO CAPS: 50000.0000 [IU] | ORAL_CAPSULE | ORAL | 0 refills | Status: DC

## 2020-07-07 NOTE — Progress Notes (Signed)
Chief Complaint:   OBESITY Stephanie Alexander is here to discuss her progress with her obesity treatment plan along with follow-up of her obesity related diagnoses. Stephanie Alexander is on the Category 2 Plan and states she is following her eating plan approximately 20% of the time. Stephanie Alexander states she is doing 0 minutes 0 times per week.  Today's visit was #: 7 Starting weight: 190 lbs Starting date: 02/26/2020 Today's weight: 186 lbs Today's date: 07/05/2020 Total lbs lost to date: 4 Total lbs lost since last in-office visit: 0  Interim History: Stephanie Alexander has had more temptations with simple carbohydrates over the holidays. She is open to discussing Christmas eating strategies.  Subjective:   1. Type 2 diabetes mellitus with other specified complication, without long-term current use of insulin (HCC) Stephanie Alexander continues to do well with diet and weight loss. She does not have a glucometer at home, but denies feeling hypoglycemic. She has questions about her Ozempic pen.  2. Vitamin D deficiency Stephanie Alexander is stable on Vit D, but her level is not yet at goal. She denies nausea, vomiting, or muscle weakness.  3. At risk for hypoglycemia Stephanie Alexander is at increased risk for hypoglycemia due to changes in diet, diagnosis of diabetes, and/or insulin use.   Assessment/Plan:   1. Type 2 diabetes mellitus with other specified complication, without long-term current use of insulin (HCC) Good blood sugar control is important to decrease the likelihood of diabetic complications such as nephropathy, neuropathy, limb loss, blindness, coronary artery disease, and death. Intensive lifestyle modification including diet, exercise and weight loss are the first line of treatment for diabetes. Stephanie Alexander will continue Ozempic and metformin, and she is to contact her pharmacist to verify that she is taking Ozempic correctly. We will send Onetouch meter, strips, and lancets (she is to check her blood sugar  BID).  - OneTouch Delica Lancets 35W MISC; Use OneTouch [employer]ica Lancets as instructed to check blood sugar twice daily.  Dispense: 100 each; Refill: 0 - glucose blood (ONETOUCH VERIO) test strip; Use OneTouch Verio test strips as instructed to check blood sugar twice daily.  Dispense: 100 each; Refill: 0 - Blood Glucose Monitoring Suppl (ONETOUCH VERIO IQ SYSTEM) w/Device KIT; Use OneTouch Verio IQ to check blood sugar twice daily.  Dispense: 1 kit; Refill: 0  2. Vitamin D deficiency Low Vitamin D level contributes to fatigue and are associated with obesity, breast, and colon cancer. We will refill prescription Vitamin D for 1 month. Stephanie Alexander will follow-up for routine testing of Vitamin D, at least 2-3 times per year to avoid over-replacement.  - Vitamin D, Ergocalciferol, (DRISDOL) 1.25 MG (50000 UNIT) CAPS capsule; Take 1 capsule (50,000 Units total) by mouth every 7 (seven) days.  Dispense: 4 capsule; Refill: 0  3. At risk for hypoglycemia Stephanie Alexander was given approximately 15 minutes of counseling today regarding prevention of hypoglycemia. She was advised of symptoms of hypoglycemia. Stephanie Alexander was instructed to avoid skipping meals, eat regular protein rich meals and schedule low calorie snacks as needed.   Repetitive spaced learning was employed today to elicit superior memory formation and behavioral change  4. Class 1 obesity with serious comorbidity and body mass index (BMI) of 32.0 to 32.9 in adult, unspecified obesity type Stephanie Alexander is currently in the action stage of change. As such, her goal is to continue with weight loss efforts. She has agreed to the Category 2 Plan.   Exercise goals: All adults should avoid inactivity. Some physical activity is better than none, and adults  who participate in any amount of physical activity gain some health benefits.  Behavioral modification strategies: increasing lean protein intake, meal planning and cooking strategies and holiday eating  strategies .  Stephanie Alexander has agreed to follow-up with our clinic in 3 weeks. She was informed of the importance of frequent follow-up visits to maximize her success with intensive lifestyle modifications for her multiple health conditions.   Objective:   Blood pressure 121/67, pulse 61, temperature 97.7 F (36.5 C), height '5\' 4"'  (1.626 m), weight 186 lb (84.4 kg), SpO2 96 %. Body mass index is 31.93 kg/m.  General: Cooperative, alert, well developed, in no acute distress. HEENT: Conjunctivae and lids unremarkable. Cardiovascular: Regular rhythm.  Lungs: Normal work of breathing. Neurologic: No focal deficits.   Lab Results  Component Value Date   CREATININE 0.67 02/26/2020   BUN 14 02/26/2020   NA 139 02/26/2020   K 4.3 02/26/2020   CL 100 02/26/2020   CO2 25 02/26/2020   Lab Results  Component Value Date   ALT 62 (H) 02/26/2020   AST 35 02/26/2020   ALKPHOS 115 02/26/2020   BILITOT 1.3 (H) 02/26/2020   Lab Results  Component Value Date   HGBA1C 10.2 (H) 02/26/2020   Lab Results  Component Value Date   INSULIN 11.9 02/26/2020   Lab Results  Component Value Date   TSH 2.430 02/26/2020   Lab Results  Component Value Date   CHOL 173 02/26/2020   HDL 45 02/26/2020   LDLCALC 102 (H) 02/26/2020   TRIG 149 02/26/2020   Lab Results  Component Value Date   WBC 7.0 02/26/2020   HGB 14.7 02/26/2020   HCT 42.2 02/26/2020   MCV 90 02/26/2020   PLT 195 02/26/2020   No results found for: IRON, TIBC, FERRITIN  Attestation Statements:   Reviewed by clinician on day of visit: allergies, medications, problem list, medical history, surgical history, family history, social history, and previous encounter notes.   I, Trixie Dredge, am acting as transcriptionist for Dennard Nip, MD.  I have reviewed the above documentation for accuracy and completeness, and I agree with the above. -  Dennard Nip, MD

## 2020-07-28 ENCOUNTER — Ambulatory Visit (INDEPENDENT_AMBULATORY_CARE_PROVIDER_SITE_OTHER): Payer: BC Managed Care – PPO | Admitting: Family Medicine

## 2020-08-16 ENCOUNTER — Other Ambulatory Visit (INDEPENDENT_AMBULATORY_CARE_PROVIDER_SITE_OTHER): Payer: Self-pay | Admitting: Family Medicine

## 2020-08-16 DIAGNOSIS — E1169 Type 2 diabetes mellitus with other specified complication: Secondary | ICD-10-CM

## 2020-08-18 ENCOUNTER — Ambulatory Visit (INDEPENDENT_AMBULATORY_CARE_PROVIDER_SITE_OTHER): Payer: BC Managed Care – PPO | Admitting: Family Medicine

## 2020-08-30 ENCOUNTER — Encounter (INDEPENDENT_AMBULATORY_CARE_PROVIDER_SITE_OTHER): Payer: Self-pay | Admitting: Family Medicine

## 2020-08-30 ENCOUNTER — Other Ambulatory Visit: Payer: Self-pay

## 2020-08-30 ENCOUNTER — Ambulatory Visit (INDEPENDENT_AMBULATORY_CARE_PROVIDER_SITE_OTHER): Payer: BC Managed Care – PPO | Admitting: Family Medicine

## 2020-08-30 VITALS — BP 121/74 | HR 64 | Temp 97.6°F | Ht 64.0 in | Wt 192.0 lb

## 2020-08-30 DIAGNOSIS — E669 Obesity, unspecified: Secondary | ICD-10-CM

## 2020-08-30 DIAGNOSIS — E559 Vitamin D deficiency, unspecified: Secondary | ICD-10-CM

## 2020-08-30 DIAGNOSIS — Z6833 Body mass index (BMI) 33.0-33.9, adult: Secondary | ICD-10-CM

## 2020-08-30 DIAGNOSIS — E1169 Type 2 diabetes mellitus with other specified complication: Secondary | ICD-10-CM

## 2020-08-30 DIAGNOSIS — Z9189 Other specified personal risk factors, not elsewhere classified: Secondary | ICD-10-CM

## 2020-08-30 MED ORDER — VITAMIN D (ERGOCALCIFEROL) 1.25 MG (50000 UNIT) PO CAPS: 50000.0000 [IU] | ORAL_CAPSULE | ORAL | 0 refills | Status: DC

## 2020-08-31 NOTE — Progress Notes (Signed)
Chief Complaint:   OBESITY Stephanie Alexander is here to discuss her progress with her obesity treatment plan along with follow-up of her obesity related diagnoses. Stephanie Alexander is on the Category 2 Plan and states she is following her eating plan approximately 0% of the time. Stephanie Alexander states she is walking the dog for 15 minutes 3-4 times per week.  Today's visit was #: 8 Starting weight: 190 lbs Starting date: 02/26/2020 Today's weight: 192 lbs Today's date: 08/30/2020 Total lbs lost to date: 0 Total lbs lost since last in-office visit: 0  Interim History: Stephanie Alexander states she has been struggling the last 3 months with following her meal plan. She notes her hunger is mostly controlled but simple carbohydrates have been creeping back in, and meal planning has been challenging.  Subjective:   1. Type 2 diabetes mellitus with other specified complication, without long-term current use of insulin (HCC) Stephanie Alexander's last A1c was elevated at 10.2. She is on Ozempic but struggles with her eating plan and weight loss efforts. She does not have her BGs log today.  2. Vitamin D deficiency Stephanie Alexander is on Vit D, and she is due for labs. She shows no signs of over-replacement.  3. At risk for heart disease Stephanie Alexander is at a higher than average risk for cardiovascular disease due to obesity and diabetes mellitus.   Assessment/Plan:   1. Type 2 diabetes mellitus with other specified complication, without long-term current use of insulin (HCC) Good blood sugar control is important to decrease the likelihood of diabetic complications such as nephropathy, neuropathy, limb loss, blindness, coronary artery disease, and death. Intensive lifestyle modification including diet, exercise and weight loss are the first line of treatment for diabetes. Stephanie Alexander will change her eating plan to journaling. We will check labs today, and will continue to follow up.  - Comprehensive metabolic panel - Lipid Panel  With LDL/HDL Ratio - Hemoglobin A1c - Insulin, random - Microalbumin / creatinine urine ratio  2. Vitamin D deficiency Low Vitamin D level contributes to fatigue and are associated with obesity, breast, and colon cancer. We will recheck labs today, and we will refill prescription Vitamin D for 1 month. Stephanie Alexander will follow-up for routine testing of Vitamin D, at least 2-3 times per year to avoid over-replacement.  - Vitamin D, Ergocalciferol, (DRISDOL) 1.25 MG (50000 UNIT) CAPS capsule; Take 1 capsule (50,000 Units total) by mouth every 7 (seven) days.  Dispense: 4 capsule; Refill: 0 - VITAMIN D 25 Hydroxy (Vit-D Deficiency, Fractures)  3. At risk for heart disease Stephanie Alexander was given approximately 15 minutes of coronary artery disease prevention counseling today. She is 60 y.o. female and has risk factors for heart disease including obesity. We discussed intensive lifestyle modifications today with an emphasis on specific weight loss instructions and strategies.   Repetitive spaced learning was employed today to elicit superior memory formation and behavioral change.  4. Class 1 obesity with serious comorbidity and body mass index (BMI) of 33.0 to 33.9 in adult, unspecified obesity type Stephanie Alexander is currently in the action stage of change. As such, her goal is to continue with weight loss efforts. She has agreed to change to keeping a food journal and adhering to recommended goals of 1300-1500 calories and 80+ grams of protein daily.   Exercise goals: As is.  Behavioral modification strategies: increasing lean protein intake, increasing water intake and keeping a strict food journal.  Stephanie Alexander has agreed to follow-up with our clinic in 2 to 3 weeks. She was informed  of the importance of frequent follow-up visits to maximize her success with intensive lifestyle modifications for her multiple health conditions.   Stephanie Alexander was informed we would discuss her lab results at her next visit  unless there is a critical issue that needs to be addressed sooner. Stephanie Alexander agreed to keep her next visit at the agreed upon time to discuss these results.  Objective:   Blood pressure 121/74, pulse 64, temperature 97.6 F (36.4 C), height 5\' 4"  (1.626 m), weight 192 lb (87.1 kg), SpO2 97 %. Body mass index is 32.96 kg/m.  General: Cooperative, alert, well developed, in no acute distress. HEENT: Conjunctivae and lids unremarkable. Cardiovascular: Regular rhythm.  Lungs: Normal work of breathing. Neurologic: No focal deficits.   Lab Results  Component Value Date   CREATININE 0.79 08/30/2020   BUN 16 08/30/2020   NA 144 08/30/2020   K 4.5 08/30/2020   CL 106 08/30/2020   CO2 22 08/30/2020   Lab Results  Component Value Date   ALT 41 (H) 08/30/2020   AST 26 08/30/2020   ALKPHOS 110 08/30/2020   BILITOT 1.2 08/30/2020   Lab Results  Component Value Date   HGBA1C 5.3 08/30/2020   HGBA1C 10.2 (H) 02/26/2020   Lab Results  Component Value Date   INSULIN 15.8 08/30/2020   INSULIN 11.9 02/26/2020   Lab Results  Component Value Date   TSH 2.430 02/26/2020   Lab Results  Component Value Date   CHOL 141 08/30/2020   HDL 54 08/30/2020   LDLCALC 65 08/30/2020   TRIG 126 08/30/2020   Lab Results  Component Value Date   WBC 7.0 02/26/2020   HGB 14.7 02/26/2020   HCT 42.2 02/26/2020   MCV 90 02/26/2020   PLT 195 02/26/2020   No results found for: IRON, TIBC, FERRITIN  Attestation Statements:   Reviewed by clinician on day of visit: allergies, medications, problem list, medical history, surgical history, family history, social history, and previous encounter notes.   I, Trixie Dredge, am acting as transcriptionist for Dennard Nip, MD.  I have reviewed the above documentation for accuracy and completeness, and I agree with the above. -  Dennard Nip, MD

## 2020-09-06 LAB — COMPREHENSIVE METABOLIC PANEL
ALT: 41 IU/L — ABNORMAL HIGH (ref 0–32)
AST: 26 IU/L (ref 0–40)
Albumin/Globulin Ratio: 1.8 (ref 1.2–2.2)
Albumin: 4.2 g/dL (ref 3.8–4.9)
Alkaline Phosphatase: 110 IU/L (ref 44–121)
BUN/Creatinine Ratio: 20 (ref 12–28)
BUN: 16 mg/dL (ref 8–27)
Bilirubin Total: 1.2 mg/dL (ref 0.0–1.2)
CO2: 22 mmol/L (ref 20–29)
Calcium: 10.2 mg/dL (ref 8.7–10.3)
Chloride: 106 mmol/L (ref 96–106)
Creatinine, Ser: 0.79 mg/dL (ref 0.57–1.00)
GFR calc Af Amer: 94 mL/min/{1.73_m2} (ref >59)
GFR calc non Af Amer: 82 mL/min/{1.73_m2} (ref >59)
Globulin, Total: 2.3 g/dL (ref 1.5–4.5)
Glucose: 122 mg/dL — ABNORMAL HIGH (ref 65–99)
Potassium: 4.5 mmol/L (ref 3.5–5.2)
Sodium: 144 mmol/L (ref 134–144)
Total Protein: 6.5 g/dL (ref 6.0–8.5)

## 2020-09-06 LAB — VITAMIN D 25 HYDROXY (VIT D DEFICIENCY, FRACTURES): Vit D, 25-Hydroxy: 60.9 ng/mL (ref 30.0–100.0)

## 2020-09-06 LAB — LIPID PANEL WITH LDL/HDL RATIO
Cholesterol, Total: 141 mg/dL (ref 100–199)
HDL: 54 mg/dL (ref >39)
LDL Chol Calc (NIH): 65 mg/dL (ref 0–99)
LDL/HDL Ratio: 1.2 ratio (ref 0.0–3.2)
Triglycerides: 126 mg/dL (ref 0–149)
VLDL Cholesterol Cal: 22 mg/dL (ref 5–40)

## 2020-09-06 LAB — MICROALBUMIN / CREATININE URINE RATIO
Creatinine, Urine: 221.4 mg/dL
Microalb/Creat Ratio: 18 mg/g creat (ref 0–29)
Microalbumin, Urine: 38.8 ug/mL

## 2020-09-06 LAB — INSULIN, RANDOM: INSULIN: 15.8 u[IU]/mL (ref 2.6–24.9)

## 2020-09-06 LAB — HEMOGLOBIN A1C
Est. average glucose Bld gHb Est-mCnc: 105 mg/dL
Hgb A1c MFr Bld: 5.3 % (ref 4.8–5.6)

## 2020-09-30 ENCOUNTER — Ambulatory Visit (INDEPENDENT_AMBULATORY_CARE_PROVIDER_SITE_OTHER): Payer: BC Managed Care – PPO | Admitting: Family Medicine

## 2020-10-03 ENCOUNTER — Other Ambulatory Visit (INDEPENDENT_AMBULATORY_CARE_PROVIDER_SITE_OTHER): Payer: Self-pay | Admitting: Family Medicine

## 2020-10-03 DIAGNOSIS — E1169 Type 2 diabetes mellitus with other specified complication: Secondary | ICD-10-CM

## 2020-10-04 NOTE — Telephone Encounter (Signed)
Refill 1 month with no refills.

## 2020-10-04 NOTE — Telephone Encounter (Signed)
Please advise if refill is appropriate in Dr. Migdalia Dk absence.

## 2020-10-04 NOTE — Telephone Encounter (Signed)
Would you like to refill? 

## 2020-10-28 ENCOUNTER — Encounter (INDEPENDENT_AMBULATORY_CARE_PROVIDER_SITE_OTHER): Payer: Self-pay | Admitting: Family Medicine

## 2020-10-28 ENCOUNTER — Other Ambulatory Visit: Payer: Self-pay

## 2020-10-28 ENCOUNTER — Ambulatory Visit (INDEPENDENT_AMBULATORY_CARE_PROVIDER_SITE_OTHER): Payer: BC Managed Care – PPO | Admitting: Family Medicine

## 2020-10-28 VITALS — BP 130/74 | HR 69 | Temp 98.4°F | Ht 64.0 in | Wt 194.0 lb

## 2020-10-28 DIAGNOSIS — Z9189 Other specified personal risk factors, not elsewhere classified: Secondary | ICD-10-CM | POA: Diagnosis not present

## 2020-10-28 DIAGNOSIS — F3289 Other specified depressive episodes: Secondary | ICD-10-CM

## 2020-10-28 DIAGNOSIS — Z6832 Body mass index (BMI) 32.0-32.9, adult: Secondary | ICD-10-CM

## 2020-10-28 DIAGNOSIS — E669 Obesity, unspecified: Secondary | ICD-10-CM | POA: Diagnosis not present

## 2020-10-28 DIAGNOSIS — E559 Vitamin D deficiency, unspecified: Secondary | ICD-10-CM | POA: Diagnosis not present

## 2020-10-28 DIAGNOSIS — E1169 Type 2 diabetes mellitus with other specified complication: Secondary | ICD-10-CM | POA: Diagnosis not present

## 2020-10-28 MED ORDER — BUPROPION HCL ER (SR) 150 MG PO TB12: 150.0000 mg | ORAL_TABLET | Freq: Every day | ORAL | 0 refills | Status: DC

## 2020-10-28 MED ORDER — OZEMPIC (1 MG/DOSE) 4 MG/3ML ~~LOC~~ SOPN: 1.0000 mg | PEN_INJECTOR | SUBCUTANEOUS | 0 refills | Status: DC

## 2020-10-28 MED ORDER — VITAMIN D (ERGOCALCIFEROL) 1.25 MG (50000 UNIT) PO CAPS: 50000.0000 [IU] | ORAL_CAPSULE | ORAL | 0 refills | Status: DC

## 2020-11-04 NOTE — Progress Notes (Signed)
Chief Complaint:   OBESITY Stephanie Alexander is here to discuss her progress with her obesity treatment plan along with follow-up of her obesity related diagnoses. Stephanie Alexander is on keeping a food journal and adhering to recommended goals of 1300-1500 calories and 80+ grams of protein daily and states she is following her eating plan approximately 0% of the time. [name] states she is doing 0 minutes 0 times per week.  Today's visit was #: 9 Starting weight: 190 lbs Starting date: 02/26/2020 Today's weight: 194 lbs Today's date: 10/28/2020 Total lbs lost to date: 0 Total lbs lost since last in-office visit: 0  Interim History: Stephanie Alexander has been working on changing her eating habits. She is more mindful but hasn't been able to journal as strictly as needed. She notes extra calories in the evening my be hurting her progress.  Subjective:   1. Vitamin D deficiency [name]'s Vit D level is now at goal on Vit D prescription. She denies nausea, vomiting, or muscle weakness. I discussed labs with the patient today.  2. Type 2 diabetes mellitus with other specified complication, without long-term current use of insulin (HCC) Stephanie Alexander's HgbA1c has improved dramatically with metformin, Ozempic, diet, and weight loss. She is now at goal and she denies signs of hypoglycemia. I discussed labs with the patient today.  3. Other depression with emotional eating Stephanie Alexander is struggling with increased cravings and some emotional eating behaviors, especially in the PM. She is stable on Lexapro.  4. At risk for impaired metabolic function Stephanie Alexander is at increased risk for impaired metabolic function if protein decreases.  Assessment/Plan:   1. Vitamin D deficiency Low Vitamin D level contributes to fatigue and are associated with obesity, breast, and colon cancer. We will refill prescription Vitamin D for 1 month. Stephanie Alexander will follow-up for routine testing of Vitamin D, at least 2-3 times  per year to avoid over-replacement.  - Vitamin D, Ergocalciferol, (DRISDOL) 1.25 MG (50000 UNIT) CAPS capsule; Take 1 capsule (50,000 Units total) by mouth every 7 (seven) days.  Dispense: 4 capsule; Refill: 0  2. Type 2 diabetes mellitus with other specified complication, without long-term current use of insulin (HCC) Good blood sugar control is important to decrease the likelihood of diabetic complications such as nephropathy, neuropathy, limb loss, blindness, coronary artery disease, and death. Intensive lifestyle modification including diet, exercise and weight loss are the first line of treatment for diabetes. We will refill Ozempic for 1 month, and will continue metformin and work on her eating plan.  - Semaglutide, 1 MG/DOSE, (OZEMPIC, 1 MG/DOSE,) 4 MG/3ML SOPN; Inject 1 mg into the skin once a week.  Dispense: 3 mL; Refill: 0  3. Other depression with emotional eating Behavior modification techniques were discussed today to help [name] deal with her emotional/non-hunger eating behaviors. Trella agreed to start Wellbutrin SR 150 mg q daily with no refills. Orders and follow up as documented in patient record.   - buPROPion (WELLBUTRIN SR) 150 MG 12 hr tablet; Take 1 tablet (150 mg total) by mouth daily.  Dispense: 30 tablet; Refill: 0  4. At risk for impaired metabolic function Stephanie Alexander was given approximately 15 minutes of impaired  metabolic function prevention counseling today. We discussed intensive lifestyle modifications today with an emphasis on specific nutrition and exercise instructions and strategies.   Repetitive spaced learning was employed today to elicit superior memory formation and behavioral change.  5. Obesity with current BMI Of 33.4 Stephanie Alexander is currently in the action stage of change. As  such, her goal is to continue with weight loss efforts. She has agreed to keeping a food journal and adhering to recommended goals of 1300-1500 calories and 80+ grams of  protein daily.   Behavioral modification strategies: increasing lean protein intake and keeping a strict food journal.  Stephanie Alexander has agreed to follow-up with our clinic in 4 weeks. She was informed of the importance of frequent follow-up visits to maximize her success with intensive lifestyle modifications for her multiple health conditions.   Objective:   Blood pressure 130/74, pulse 69, temperature 98.4 F (36.9 C), height 5\' 4"  (1.626 m), weight 194 lb (88 kg), SpO2 95 %. Body mass index is 33.3 kg/m.  General: Cooperative, alert, well developed, in no acute distress. HEENT: Conjunctivae and lids unremarkable. Cardiovascular: Regular rhythm.  Lungs: Normal work of breathing. Neurologic: No focal deficits.   Lab Results  Component Value Date   CREATININE 0.79 08/30/2020   BUN 16 08/30/2020   NA 144 08/30/2020   K 4.5 08/30/2020   CL 106 08/30/2020   CO2 22 08/30/2020   Lab Results  Component Value Date   ALT 41 (H) 08/30/2020   AST 26 08/30/2020   ALKPHOS 110 08/30/2020   BILITOT 1.2 08/30/2020   Lab Results  Component Value Date   HGBA1C 5.3 08/30/2020   HGBA1C 10.2 (H) 02/26/2020   Lab Results  Component Value Date   INSULIN 15.8 08/30/2020   INSULIN 11.9 02/26/2020   Lab Results  Component Value Date   TSH 2.430 02/26/2020   Lab Results  Component Value Date   CHOL 141 08/30/2020   HDL 54 08/30/2020   LDLCALC 65 08/30/2020   TRIG 126 08/30/2020   Lab Results  Component Value Date   WBC 7.0 02/26/2020   HGB 14.7 02/26/2020   HCT 42.2 02/26/2020   MCV 90 02/26/2020   PLT 195 02/26/2020   No results found for: IRON, TIBC, FERRITIN  Attestation Statements:   Reviewed by clinician on day of visit: allergies, medications, problem list, medical history, surgical history, family history, social history, and previous encounter notes.   I, Trixie Dredge, am acting as transcriptionist for Dennard Nip, MD.  I have reviewed the above documentation  for accuracy and completeness, and I agree with the above. -  Dennard Nip, MD

## 2020-11-23 ENCOUNTER — Other Ambulatory Visit (INDEPENDENT_AMBULATORY_CARE_PROVIDER_SITE_OTHER): Payer: Self-pay | Admitting: Family Medicine

## 2020-11-23 DIAGNOSIS — F3289 Other specified depressive episodes: Secondary | ICD-10-CM

## 2020-11-25 ENCOUNTER — Ambulatory Visit (INDEPENDENT_AMBULATORY_CARE_PROVIDER_SITE_OTHER): Payer: BC Managed Care – PPO | Admitting: Family Medicine

## 2020-11-30 ENCOUNTER — Ambulatory Visit (INDEPENDENT_AMBULATORY_CARE_PROVIDER_SITE_OTHER): Payer: BC Managed Care – PPO | Admitting: Family Medicine

## 2020-12-26 ENCOUNTER — Other Ambulatory Visit (INDEPENDENT_AMBULATORY_CARE_PROVIDER_SITE_OTHER): Payer: Self-pay | Admitting: Family Medicine

## 2020-12-26 DIAGNOSIS — E559 Vitamin D deficiency, unspecified: Secondary | ICD-10-CM

## 2021-01-29 ENCOUNTER — Other Ambulatory Visit (INDEPENDENT_AMBULATORY_CARE_PROVIDER_SITE_OTHER): Payer: Self-pay | Admitting: Family Medicine

## 2021-01-29 DIAGNOSIS — E1169 Type 2 diabetes mellitus with other specified complication: Secondary | ICD-10-CM

## 2021-01-31 ENCOUNTER — Other Ambulatory Visit (INDEPENDENT_AMBULATORY_CARE_PROVIDER_SITE_OTHER): Payer: Self-pay | Admitting: Family Medicine

## 2021-01-31 DIAGNOSIS — E1169 Type 2 diabetes mellitus with other specified complication: Secondary | ICD-10-CM

## 2021-01-31 NOTE — Telephone Encounter (Signed)
Last OV with Dr. Beasley 

## 2021-02-03 ENCOUNTER — Other Ambulatory Visit (INDEPENDENT_AMBULATORY_CARE_PROVIDER_SITE_OTHER): Payer: Self-pay | Admitting: Family Medicine

## 2021-02-03 DIAGNOSIS — E559 Vitamin D deficiency, unspecified: Secondary | ICD-10-CM

## 2021-06-28 ENCOUNTER — Ambulatory Visit: Payer: Self-pay | Admitting: Internal Medicine

## 2021-08-02 ENCOUNTER — Encounter: Payer: Self-pay | Admitting: Internal Medicine

## 2021-08-02 ENCOUNTER — Ambulatory Visit: Payer: BC Managed Care – PPO | Admitting: Internal Medicine

## 2021-08-02 ENCOUNTER — Other Ambulatory Visit: Payer: Self-pay | Admitting: Internal Medicine

## 2021-08-02 VITALS — BP 110/74 | HR 65 | Ht 64.0 in | Wt 204.0 lb

## 2021-08-02 DIAGNOSIS — K219 Gastro-esophageal reflux disease without esophagitis: Secondary | ICD-10-CM | POA: Diagnosis not present

## 2021-08-02 DIAGNOSIS — Z86010 Personal history of colonic polyps: Secondary | ICD-10-CM | POA: Diagnosis not present

## 2021-08-02 DIAGNOSIS — R1013 Epigastric pain: Secondary | ICD-10-CM

## 2021-08-02 DIAGNOSIS — Z6835 Body mass index (BMI) 35.0-35.9, adult: Secondary | ICD-10-CM

## 2021-08-02 MED ORDER — PLENVU 140 G PO SOLR: 1.0000 | Freq: Once | ORAL | 0 refills | Status: AC

## 2021-08-02 MED ORDER — PANTOPRAZOLE SODIUM 40 MG PO TBEC: 40.0000 mg | DELAYED_RELEASE_TABLET | Freq: Every day | ORAL | 3 refills | Status: DC

## 2021-08-02 NOTE — Patient Instructions (Signed)
If you are age 62 or older, your body mass index should be between 23-30. Your Body mass index is 35.02 kg/m. If this is out of the aforementioned range listed, please consider follow up with your Primary Care Provider.  If you are age 18 or younger, your body mass index should be between 19-25. Your Body mass index is 35.02 kg/m. If this is out of the aformentioned range listed, please consider follow up with your Primary Care Provider.   ________________________________________________________  The [facility] providers would like to encourage you to use [facility] to communicate with providers for non-urgent requests or questions.  Due to long hold times on the telephone, sending your provider a message by [facility] may be a faster and more efficient way to get a response.  Please allow 48 business hours for a response.  Please remember that this is for non-urgent requests.  _______________________________________________________  Stephanie Alexander have been scheduled for an endoscopy and colonoscopy. Please follow the written instructions given to you at your visit today. Please pick up your prep supplies at the pharmacy within the next 1-3 days. If you use inhalers (even only as needed), please bring them with you on the day of your procedure.

## 2021-08-02 NOTE — Progress Notes (Signed)
HISTORY OF PRESENT ILLNESS:  Stephanie Alexander is a 61 y.o. female, part-time Sales executive, with hypertension, type 2 diabetes, hyperlipidemia, obesity, sleep apnea, elevated liver test secondary to fatty liver, asthma, and chronic GERD.  She is referred today by my patient, Stephanie Alexander, regarding problems with worsening reflux.  The patient had GI care here remotely with Dr. Olevia Perches (colonoscopy 2004 with non-adenomatous polyp).  Subsequently care with [facility], Dr. Penelope Coop, until his retirement.  She reports having had several colonoscopies (2013, 2018) with the finding of precancerous polyps and follow-up recommendation in 5 years.  Last examination she reports about 5 years ago.  She request surveillance colonoscopy.  Outside records have been requested.  Patient's chief complaint today is poorly controlled GERD.  She has frequent problems with regurgitation pyrosis.  Often worse at night.  She had been on over-the-counter omeprazole for years.  However, a nurse friend told her that this causes osteoporosis.  She subsequently put herself on famotidine with marked worsening of symptoms.  She denies esophageal dysphagia.  No prior upper endoscopy.  She has gained 15 or 20 pounds over the past year.  She is frustrated that when exercising, her reflux symptoms are worse.  Patient does drink 1 generous glass of wine daily.  She is a reformed smoker.  Abdominal ultrasound performed July 02, 2018 revealed hepatic steatosis.  No gallstones.  Review of blood work from February 2022 revealed an ALT of 41.  Liver test otherwise normal.  Normal protein, albumin, and globulins.  Hemoglobin A1c 5.3  REVIEW OF SYSTEMS:  All non-GI ROS negative unless otherwise stated in the HPI except for cough  Past Medical History:  Diagnosis Date   Anxiety    Asthma    Elevated blood sugar    Elevated LFTs    Fatty liver    GERD (gastroesophageal reflux disease)    Headache    Herpes  simplex type 1 antibody positive    Hyperlipidemia    Hypertension    Prediabetes    Sleep apnea    Stomach ulcer     Past Surgical History:  Procedure Laterality Date   BREAST REDUCTION SURGERY     eye surgeries      Social History Stephanie Alexander  reports that she has quit smoking. Her smoking use included cigarettes. She has never used smokeless tobacco. She reports current alcohol use of about 7.0 standard drinks per week. She reports that she does not use drugs.  family history includes Anxiety disorder in her mother; Diabetes in her father; Heart disease in her father and mother; Hyperlipidemia in her father; Hypertension in her father; Kidney disease in her father.  Allergies  Allergen Reactions   Benicar [Olmesartan]    Erythromycin    Lisinopril     headache   Sulfa Antibiotics    Sulfacetamide    Toprol Xl [Metoprolol Tartrate]        PHYSICAL EXAMINATION: Vital signs: BP 110/74    Pulse 65    Ht 5\' 4"  (1.626 m)    Wt 204 lb (92.5 kg)    BMI 35.02 kg/m   Constitutional: generally well-appearing, no acute distress Psychiatric: alert and oriented x3, cooperative Eyes: extraocular movements intact, anicteric, conjunctiva pink Mouth: oral pharynx moist, no lesions Neck: supple no lymphadenopathy Cardiovascular: heart regular rate and rhythm, no murmur Lungs: clear to auscultation bilaterally Abdomen: soft, nontender, nondistended, no obvious ascites, no peritoneal signs, normal bowel sounds, no organomegaly Rectal: Deferred  until colonoscopy Extremities: no clubbing, cyanosis, or lower extremity edema bilaterally Skin: no lesions on visible extremities Neuro: No focal deficits.  Cranial nerves intact  ASSESSMENT:  1.  Chronic GERD.  Symptoms incompletely controlled with PPI.  Symptoms terrible on H2 receptor antagonist. 2.  Obesity 3.  Fatty liver 4.  Metabolic syndrome 5.  History of precancerous polyps due for surveillance   PLAN:  1.  Reflux  precautions 2.  Weight loss 3.  Prescribe pantoprazole 40 mg daily. 4.  Schedule upper endoscopy to evaluate chronic GERD not controlled with PPI.  The patient is high risk given her comorbidities and body habitus..The nature of the procedure, as well as the risks, benefits, and alternatives were carefully and thoroughly reviewed with the patient. Ample time for discussion and questions allowed. The patient understood, was satisfied, and agreed to proceed.  5.  Schedule surveillance colonoscopy.  The patient is high risk as above..The nature of the procedure, as well as the risks, benefits, and alternatives were carefully and thoroughly reviewed with the patient. Ample time for discussion and questions allowed. The patient understood, was satisfied, and agreed to proceed.  6.  Obtain outside records for review.

## 2021-09-22 ENCOUNTER — Encounter: Payer: Self-pay | Admitting: Internal Medicine

## 2021-09-26 ENCOUNTER — Telehealth: Payer: Self-pay | Admitting: Internal Medicine

## 2021-09-26 ENCOUNTER — Encounter: Payer: BC Managed Care – PPO | Admitting: Internal Medicine

## 2021-09-26 NOTE — Telephone Encounter (Signed)
Good Morning Dr. Henrene Pastor, ? ?Called this patient at 7:20 am to see if she would be coming for her procedure.  She stated that she had cancelled a few weeks ago, reason for cancellation was she could not get off work. She stated she would call back once she knew her schedule.   ?

## 2021-09-26 NOTE — Telephone Encounter (Signed)
Did not notice any evidence for cancellation of the record ?

## 2021-12-27 ENCOUNTER — Encounter: Payer: Self-pay | Admitting: Internal Medicine

## 2022-02-20 ENCOUNTER — Ambulatory Visit (AMBULATORY_SURGERY_CENTER): Payer: BC Managed Care – PPO | Admitting: *Deleted

## 2022-02-20 VITALS — Ht 64.0 in | Wt 200.0 lb

## 2022-02-20 DIAGNOSIS — Z86010 Personal history of colonic polyps: Secondary | ICD-10-CM

## 2022-02-20 DIAGNOSIS — K219 Gastro-esophageal reflux disease without esophagitis: Secondary | ICD-10-CM

## 2022-02-20 DIAGNOSIS — R1013 Epigastric pain: Secondary | ICD-10-CM

## 2022-02-20 MED ORDER — NA SULFATE-K SULFATE-MG SULF 17.5-3.13-1.6 GM/177ML PO SOLN: 1.0000 | ORAL | 0 refills | Status: DC

## 2022-02-20 NOTE — Progress Notes (Signed)
Patient's pre-visit was done today over the phone with the patient. Name,DOB and address verified. Patient denies any allergies to Eggs and Soy. Patient denies any problems with anesthesia/sedation. Patient is not taking any diet pills or blood thinners. No home Oxygen. Insurance confirmed with patient.  Went over prep instructions with patient. Prep instructions mailed to pt-pt is aware. Patient understands to call us back with any questions or concerns. Patient is aware of our care-partner policy.

## 2022-02-22 ENCOUNTER — Encounter (INDEPENDENT_AMBULATORY_CARE_PROVIDER_SITE_OTHER): Payer: Self-pay

## 2022-03-07 ENCOUNTER — Encounter: Payer: Self-pay | Admitting: Internal Medicine

## 2022-03-13 ENCOUNTER — Encounter: Payer: Self-pay | Admitting: Internal Medicine

## 2022-03-13 ENCOUNTER — Ambulatory Visit (AMBULATORY_SURGERY_CENTER): Payer: BC Managed Care – PPO | Admitting: Internal Medicine

## 2022-03-13 VITALS — BP 147/63 | HR 66 | Temp 98.0°F | Resp 18 | Ht 64.0 in | Wt 200.0 lb

## 2022-03-13 DIAGNOSIS — D122 Benign neoplasm of ascending colon: Secondary | ICD-10-CM

## 2022-03-13 DIAGNOSIS — Z860100 Personal history of colon polyps, unspecified: Secondary | ICD-10-CM

## 2022-03-13 DIAGNOSIS — Z09 Encounter for follow-up examination after completed treatment for conditions other than malignant neoplasm: Secondary | ICD-10-CM

## 2022-03-13 DIAGNOSIS — K219 Gastro-esophageal reflux disease without esophagitis: Secondary | ICD-10-CM | POA: Diagnosis not present

## 2022-03-13 MED ORDER — SODIUM CHLORIDE 0.9 % IV SOLN: 500.0000 mL | INTRAVENOUS | Status: DC

## 2022-03-13 NOTE — Patient Instructions (Signed)
Handouts on polyps, diverticulosis, and hemorrhoids given to you today   YOU HAD AN ENDOSCOPIC PROCEDURE TODAY AT [facility]:   Refer to the procedure report that was given to you for any specific questions about what was found during the examination.  If the procedure report does not answer your questions, please call your gastroenterologist to clarify.  If you requested that your care partner not be given the details of your procedure findings, then the procedure report has been included in a sealed envelope for you to review at your convenience later.  YOU SHOULD EXPECT: Some feelings of bloating in the abdomen. Passage of more gas than usual.  Walking can help get rid of the air that was put into your GI tract during the procedure and reduce the bloating. If you had a lower endoscopy (such as a colonoscopy or flexible sigmoidoscopy) you may notice spotting of blood in your stool or on the toilet paper. If you underwent a bowel prep for your procedure, you may not have a normal bowel movement for a few days.  Please Note:  You might notice some irritation and congestion in your nose or some drainage.  This is from the oxygen used during your procedure.  There is no need for concern and it should clear up in a day or so.  SYMPTOMS TO REPORT IMMEDIATELY:  Following lower endoscopy (colonoscopy or flexible sigmoidoscopy):  Excessive amounts of blood in the stool  Significant tenderness or worsening of abdominal pains  Swelling of the abdomen that is new, acute  Fever of 100F or higher  Following upper endoscopy (EGD)  Vomiting of blood or coffee ground material  New chest pain or pain under the shoulder blades  Painful or persistently difficult swallowing  New shortness of breath  Fever of 100F or higher  Black, tarry-looking stools  For urgent or emergent issues, a gastroenterologist can be reached at any hour by calling [phone]. Do not use MyChart messaging for  urgent concerns.    DIET:  We do recommend a small meal at first, but then you may proceed to your regular diet.  Drink plenty of fluids but you should avoid alcoholic beverages for 24 hours.  ACTIVITY:  You should plan to take it easy for the rest of today and you should NOT DRIVE or use heavy machinery until tomorrow (because of the sedation medicines used during the test).    FOLLOW UP: Our staff will call the number listed on your records the next business day following your procedure.  We will call around 7:15- 8:00 am to check on you and address any questions or concerns that you may have regarding the information given to you following your procedure. If we do not reach you, we will leave a message.  If you develop any symptoms (ie: fever, flu-like symptoms, shortness of breath, cough etc.) before then, please call [phone].  If you test positive for Covid 19 in the 2 weeks post procedure, please call and report this information to [place].    If any biopsies were taken you will be contacted by phone or by letter within the next 1-3 weeks.  Please call us at [phone] if you have not heard about the biopsies in 3 weeks.    SIGNATURES/CONFIDENTIALITY: You and/or your care partner have signed paperwork which will be entered into your electronic medical record.  These signatures attest to the fact that that the information above on your After Visit Summary  has been reviewed and is understood.  Full responsibility of the confidentiality of this discharge information lies with you and/or your care-partner.  

## 2022-03-13 NOTE — Op Note (Signed)
[name] Patient Name: Stephanie Alexander Procedure Date: 03/13/2022 7:57 AM MRN: [id number] Endoscopist: Docia Chuck. Henrene Pastor , MD Age: 61 Referring MD:  Date of Birth: [birth year] Gender: Female Account #: [id number] Procedure:                Upper GI endoscopy Indications:              Esophageal reflux Medicines:                Monitored Anesthesia Care Procedure:                Pre-Anesthesia Assessment:                           - Prior to the procedure, a History and Physical                            was performed, and patient medications and                            allergies were reviewed. The patient's tolerance of                            previous anesthesia was also reviewed. The risks                            and benefits of the procedure and the sedation                            options and risks were discussed with the patient.                            All questions were answered, and informed consent                            was obtained. Prior Anticoagulants: The patient has                            taken no previous anticoagulant or antiplatelet                            agents. ASA Grade Assessment: II - A patient with                            mild systemic disease. After reviewing the risks                            and benefits, the patient was deemed in                            satisfactory condition to undergo the procedure.                           After obtaining informed consent, the endoscope was  passed under direct vision. Throughout the                            procedure, the patient's blood pressure, pulse, and                            oxygen saturations were monitored continuously. The                            Endoscope was introduced through the mouth, and                            advanced to the second part of duodenum. The upper                            GI endoscopy was accomplished without  difficulty.                            The patient tolerated the procedure well. Scope In: Scope Out: Findings:                 The esophagus was normal.                           The stomach was normal.                           The examined duodenum was normal.                           The cardia and gastric fundus were normal on                            retroflexion. Complications:            No immediate complications. Estimated Blood Loss:     Estimated blood loss: none. Impression:               - Normal esophagus.                           - Normal stomach.                           - Normal examined duodenum.                           - No specimens collected. Recommendation:           - Patient has a contact number available for                            emergencies. The signs and symptoms of potential                            delayed complications were discussed with the  patient. Return to normal activities tomorrow.                            Written discharge instructions were provided to the                            patient.                           - Resume previous diet.                           - Continue present medications. Docia Chuck. Henrene Pastor, MD 03/13/2022 8:56:21 AM This report has been signed electronically.

## 2022-03-13 NOTE — Progress Notes (Signed)
Pt's states no medical or surgical changes since previsit or office visit. 

## 2022-03-13 NOTE — Progress Notes (Signed)
HISTORY OF PRESENT ILLNESS:   Stephanie Alexander is a 61 y.o. female, part-time Sales executive, with hypertension, type 2 diabetes, hyperlipidemia, obesity, sleep apnea, elevated liver test secondary to fatty liver, asthma, and chronic GERD.  She is referred today by my patient, Winona Legato, regarding problems with worsening reflux.  The patient had GI care here remotely with Dr. Olevia Perches (colonoscopy 2004 with non-adenomatous polyp).  Subsequently care with [facility], Dr. Penelope Coop, until his retirement.  She reports having had several colonoscopies (2013, 2018) with the finding of precancerous polyps and follow-up recommendation in 5 years.  Last examination she reports about 5 years ago.  She request surveillance colonoscopy.  Outside records have been requested.  Patient's chief complaint today is poorly controlled GERD.  She has frequent problems with regurgitation pyrosis.  Often worse at night.  She had been on over-the-counter omeprazole for years.  However, a nurse friend told her that this causes osteoporosis.  She subsequently put herself on famotidine with marked worsening of symptoms.  She denies esophageal dysphagia.  No prior upper endoscopy.  She has gained 15 or 20 pounds over the past year.  She is frustrated that when exercising, her reflux symptoms are worse.  Patient does drink 1 generous glass of wine daily.  She is a reformed smoker.  Abdominal ultrasound performed July 02, 2018 revealed hepatic steatosis.  No gallstones.  Review of blood work from February 2022 revealed an ALT of 41.  Liver test otherwise normal.  Normal protein, albumin, and globulins.  Hemoglobin A1c 5.3   REVIEW OF SYSTEMS:   All non-GI ROS negative unless otherwise stated in the HPI except for cough       Past Medical History:  Diagnosis Date   Anxiety     Asthma     Elevated blood sugar     Elevated LFTs     Fatty liver     GERD (gastroesophageal reflux disease)     Headache      Herpes simplex type 1 antibody positive     Hyperlipidemia     Hypertension     Prediabetes     Sleep apnea     Stomach ulcer             Past Surgical History:  Procedure Laterality Date   BREAST REDUCTION SURGERY       eye surgeries          Social History Stephanie Alexander  reports that she has quit smoking. Her smoking use included cigarettes. She has never used smokeless tobacco. She reports current alcohol use of about 7.0 standard drinks per week. She reports that she does not use drugs.   family history includes Anxiety disorder in her mother; Diabetes in her father; Heart disease in her father and mother; Hyperlipidemia in her father; Hypertension in her father; Kidney disease in her father.        Allergies  Allergen Reactions   Benicar [Olmesartan]     Erythromycin     Lisinopril        headache   Sulfa Antibiotics     Sulfacetamide     Toprol Xl [Metoprolol Tartrate]            PHYSICAL EXAMINATION: Vital signs: BP 110/74   Pulse 65   Ht '5\' 4"'$  (1.626 m)   Wt 204 lb (92.5 kg)   BMI 35.02 kg/m   Constitutional: generally well-appearing, no acute distress Psychiatric: alert and oriented x3,  cooperative Eyes: extraocular movements intact, anicteric, conjunctiva pink Mouth: oral pharynx moist, no lesions Neck: supple no lymphadenopathy Cardiovascular: heart regular rate and rhythm, no murmur Lungs: clear to auscultation bilaterally Abdomen: soft, nontender, nondistended, no obvious ascites, no peritoneal signs, normal bowel sounds, no organomegaly Rectal: Deferred until colonoscopy Extremities: no clubbing, cyanosis, or lower extremity edema bilaterally Skin: no lesions on visible extremities Neuro: No focal deficits.  Cranial nerves intact   ASSESSMENT:   1.  Chronic GERD.  Symptoms incompletely controlled with PPI.  Symptoms terrible on H2 receptor antagonist. 2.  Obesity 3.  Fatty liver 4.  Metabolic syndrome 5.  History of precancerous polyps  due for surveillance     PLAN:   1.  Reflux precautions 2.  Weight loss 3.  Prescribe pantoprazole 40 mg daily. 4.  Schedule upper endoscopy to evaluate chronic GERD not controlled with PPI.  The patient is high risk given her comorbidities and body habitus..The nature of the procedure, as well as the risks, benefits, and alternatives were carefully and thoroughly reviewed with the patient. Ample time for discussion and questions allowed. The patient understood, was satisfied, and agreed to proceed.  5.  Schedule surveillance colonoscopy.  The patient is high risk as above..The nature of the procedure, as well as the risks, benefits, and alternatives were carefully and thoroughly reviewed with the patient. Ample time for discussion and questions allowed. The patient understood, was satisfied, and agreed to proceed.  6.  Obtain outside records for review.  As above.  No interval change.  Now for colonoscopy and upper endoscopy

## 2022-03-13 NOTE — Progress Notes (Signed)
Called to room to assist during endoscopic procedure.  Patient ID and intended procedure confirmed with present staff. Received instructions for my participation in the procedure from the performing physician.  

## 2022-03-13 NOTE — Progress Notes (Signed)
A and O x3. Report to RN. Tolerated MAC anesthesia well.Teeth unchanged after procedure. 

## 2022-03-13 NOTE — Op Note (Signed)
[place] Patient Name: Stephanie Alexander Procedure Date: 03/13/2022 7:58 AM MRN: [id number] Endoscopist: Docia Chuck. Henrene Pastor , MD Age: 61 Referring MD:  Date of Birth: [birth year] Gender: Female Account #: [id number] Procedure:                Colonoscopy with cold snare polypectomy x 2 Indications:              High risk colon cancer surveillance: Personal                            history of multiple (3 or more) adenomas. Previous                            examinations elsewhere 2004, 2013, 2018 Medicines:                Monitored Anesthesia Care Procedure:                Pre-Anesthesia Assessment:                           - Prior to the procedure, a History and Physical                            was performed, and patient medications and                            allergies were reviewed. The patient's tolerance of                            previous anesthesia was also reviewed. The risks                            and benefits of the procedure and the sedation                            options and risks were discussed with the patient.                            All questions were answered, and informed consent                            was obtained. Prior Anticoagulants: The patient has                            taken no previous anticoagulant or antiplatelet                            agents. After reviewing the risks and benefits, the                            patient was deemed in satisfactory condition to                            undergo the procedure.  After obtaining informed consent, the colonoscope                            was passed under direct vision. Throughout the                            procedure, the patient's blood pressure, pulse, and                            oxygen saturations were monitored continuously. The                            Olympus CF-HQ190L (Serial# [id number]) Colonoscope was                             introduced through the anus and advanced to the the                            cecum, identified by appendiceal orifice and                            ileocecal valve. The ileocecal valve, appendiceal                            orifice, and rectum were photographed. The quality                            of the bowel preparation was excellent. The                            colonoscopy was performed without difficulty. The                            patient tolerated the procedure well. The bowel                            preparation used was SUPREP via split dose                            instruction. Scope In: 8:27:20 AM Scope Out: 8:42:27 AM Scope Withdrawal Time: 0 hours 11 minutes 12 seconds  Total Procedure Duration: 0 hours 15 minutes 7 seconds  Findings:                 Two polyps were found in the ascending colon. The                            polyps were 1 to 3 mm in size. These polyps were                            removed with a cold snare. Resection and retrieval                            were complete.  Multiple diverticula were found in the left colon.                           Internal hemorrhoids were found during retroflexion.                           The exam was otherwise without abnormality on                            direct and retroflexion views. Complications:            No immediate complications. Estimated blood loss:                            None. Estimated Blood Loss:     Estimated blood loss: none. Impression:               - Two 1 to 3 mm polyps in the ascending colon,                            removed with a cold snare. Resected and retrieved.                           - Diverticulosis in the left colon.                           - Internal hemorrhoids.                           - The examination was otherwise normal on direct                            and retroflexion views. Recommendation:           - Repeat  colonoscopy in 5 years for surveillance.                           - Patient has a contact number available for                            emergencies. The signs and symptoms of potential                            delayed complications were discussed with the                            patient. Return to normal activities tomorrow.                            Written discharge instructions were provided to the                            patient.                           - Resume previous diet.                           -  Continue present medications.                           - Await pathology results. Docia Chuck. Henrene Pastor, MD 03/13/2022 8:49:32 AM This report has been signed electronically.

## 2022-03-14 ENCOUNTER — Telehealth: Payer: Self-pay | Admitting: *Deleted

## 2022-03-14 NOTE — Telephone Encounter (Signed)
  Follow up Call-     03/13/2022    7:44 AM  Call back number  Post procedure Call Back phone  # [phone]  Permission to leave phone message Yes     Patient questions:  Do you have a fever, pain , or abdominal swelling? No. Pain Score  0 *  Have you tolerated food without any problems? Yes.    Have you been able to return to your normal activities? Yes.    Do you have any questions about your discharge instructions: Diet   No. Medications  No. Follow up visit  No.  Do you have questions or concerns about your Care? No.  Actions: * If pain score is 4 or above: No action needed, pain <4.

## 2022-03-15 NOTE — Telephone Encounter (Signed)
Pt called and states she is not feeling well today, her right groin is sore and she is unable to lift her leg. Pt is requesting a call back on her work phone, [phone]. Thank you.

## 2022-03-17 NOTE — Telephone Encounter (Signed)
Patient called states her symptoms are getting worst and she still has not heard from a nurse. Requesting a call back to discuss further.

## 2022-03-17 NOTE — Telephone Encounter (Signed)
Left message on machine to call back  

## 2022-03-17 NOTE — Telephone Encounter (Signed)
Please route this to MD or his nurse, not [place]

## 2022-03-18 ENCOUNTER — Encounter: Payer: Self-pay | Admitting: Internal Medicine

## 2022-03-21 NOTE — Telephone Encounter (Signed)
Left message for pt to call back  °

## 2022-03-22 NOTE — Telephone Encounter (Signed)
Patient did not return the call, will await further communication from the pt.

## 2022-08-16 ENCOUNTER — Other Ambulatory Visit: Payer: Self-pay | Admitting: Internal Medicine

## 2023-08-30 ENCOUNTER — Other Ambulatory Visit: Payer: Self-pay | Admitting: Internal Medicine

## 2023-09-26 ENCOUNTER — Telehealth: Payer: Self-pay | Admitting: Internal Medicine

## 2023-09-26 NOTE — Telephone Encounter (Signed)
 Inbound calling to refill for pantoprazole sent into [facility] on [address]. Please advise.   Thank you

## 2023-09-27 MED ORDER — PANTOPRAZOLE SODIUM 40 MG PO TBEC: 40.0000 mg | DELAYED_RELEASE_TABLET | Freq: Every day | ORAL | 0 refills | Status: DC

## 2023-09-27 NOTE — Telephone Encounter (Signed)
 Protonix refilled

## 2023-12-04 ENCOUNTER — Ambulatory Visit: Payer: BC Managed Care – PPO | Admitting: Internal Medicine

## 2023-12-21 ENCOUNTER — Other Ambulatory Visit: Payer: Self-pay | Admitting: Internal Medicine

## 2023-12-24 ENCOUNTER — Other Ambulatory Visit: Payer: Self-pay | Admitting: Internal Medicine

## 2024-01-08 ENCOUNTER — Telehealth: Payer: Self-pay | Admitting: Internal Medicine

## 2024-01-08 NOTE — Telephone Encounter (Signed)
 Requesting medication refill for protonix . Please advise.

## 2024-01-09 MED ORDER — PANTOPRAZOLE SODIUM 40 MG PO TBEC: 40.0000 mg | DELAYED_RELEASE_TABLET | Freq: Every day | ORAL | 0 refills | Status: DC

## 2024-01-09 NOTE — Telephone Encounter (Signed)
 Protonix refilled

## 2024-01-10 ENCOUNTER — Ambulatory Visit: Admitting: Physician Assistant

## 2024-01-10 ENCOUNTER — Encounter: Payer: Self-pay | Admitting: Physician Assistant

## 2024-01-10 VITALS — BP 144/72 | HR 62 | Ht 64.0 in | Wt 206.0 lb

## 2024-01-10 DIAGNOSIS — Z7985 Long-term (current) use of injectable non-insulin antidiabetic drugs: Secondary | ICD-10-CM | POA: Diagnosis not present

## 2024-01-10 DIAGNOSIS — K5900 Constipation, unspecified: Secondary | ICD-10-CM | POA: Diagnosis not present

## 2024-01-10 DIAGNOSIS — K219 Gastro-esophageal reflux disease without esophagitis: Secondary | ICD-10-CM | POA: Diagnosis not present

## 2024-01-10 DIAGNOSIS — Z860101 Personal history of adenomatous and serrated colon polyps: Secondary | ICD-10-CM | POA: Diagnosis not present

## 2024-01-10 MED ORDER — PANTOPRAZOLE SODIUM 40 MG PO TBEC: 40.0000 mg | DELAYED_RELEASE_TABLET | Freq: Every day | ORAL | 3 refills | Status: AC

## 2024-01-10 NOTE — Progress Notes (Signed)
 Ellouise Console, PA-C [address] [place], [place]  [address] Phone: [phone]   Primary Care Physician: [name] Pfeiffer, PA-C  Primary Gastroenterologist:  Ellouise Console, PA-C / Norleen Kiang, MD   Chief Complaint:  F/U GERD, Med Refill       HPI:   Stephanie Alexander is a 63 y.o. female, established patient Dr. Kiang, returns for follow-up of GERD and medication refill.  Currently taking pantoprazole  40 Mg once daily.  Her acid reflux is controlled on this medicine.  If she misses pantoprazole  for 3 or 4 days, then she has recurrent acid reflux.  She denies breakthrough heartburn on medication.  Denies dysphagia.  She would like to continue pantoprazole .  She has had some mild increased constipation in the past year.  Started after she was on Ozempic  and then Mounjaro to help with weight loss.  No current treatment for constipation.  Denies any other GI symptoms.  03/13/2022 EGD by Dr. Kiang: Normal.  No biopsies.  03/13/2022 colonoscopy by Dr. Kiang: 2 small (1 mm to 3 mm) tubular adenoma polyps removed, diverticulosis, internal hemorrhoids, otherwise normal.  Excellent prep.  5-year repeat (due 02/2027).  She also had previous colonoscopies 2004, 2013, 2018.  Previous history of adenomatous colon polyps.  Current Outpatient Medications  Medication Sig Dispense Refill   albuterol (VENTOLIN HFA) 108 (90 Base) MCG/ACT inhaler SMARTSIG:2 Puff(s) By Mouth Every 4 Hours     atorvastatin (LIPITOR) 20 MG tablet      Blood Glucose Monitoring Suppl (ONETOUCH VERIO IQ SYSTEM) w/Device KIT Use OneTouch Verio IQ to check blood sugar twice daily. 1 kit 0   Chlorpheniramine Maleate (ALLERGY PO) Take 1 tablet by mouth daily before breakfast. Allertec     escitalopram (LEXAPRO) 10 MG tablet Take 10 mg by mouth daily.     Insulin  Pen Needle 32G X 4 MM MISC 1 each by Does not apply route once a week. 50 each 0   losartan-hydrochlorothiazide (HYZAAR) 100-12.5 MG tablet Take 1 tablet by mouth  daily.     metoprolol succinate (TOPROL-XL) 25 MG 24 hr tablet Take 25 mg by mouth daily.     MOUNJARO 7.5 MG/0.5ML Pen Inject 7.5 mg into the skin once a week.     OneTouch Delica Lancets 30G MISC Use OneTouch Delica Lancets as instructed to check blood sugar twice daily. 100 each 0   ONETOUCH VERIO test strip USE ONETOUCH VERIO TEST STRIPS AS INSTRUCTED TO CHECK BLOOD SUGAR TWICE DAILY. 100 strip 0   traZODone (DESYREL) 50 MG tablet Take 50 mg by mouth at bedtime.     alendronate (FOSAMAX) 70 MG tablet Take 70 mg by mouth once a week. (Patient not taking: Reported on 01/10/2024)     pantoprazole  (PROTONIX ) 40 MG tablet Take 1 tablet (40 mg total) by mouth daily. 90 tablet 3   No current facility-administered medications for this visit.    Allergies as of 01/10/2024 - Review Complete 01/10/2024  Allergen Reaction Noted   Benicar [olmesartan]  04/24/2016   Erythromycin Other (See Comments) 04/24/2016   Lisinopril  04/24/2016   Mederma Itching 02/20/2022   Sulfa antibiotics  04/24/2016   Sulfacetamide  04/24/2016   Toprol xl [metoprolol tartrate] Other (See Comments) 04/24/2016    Past Medical History:  Diagnosis Date   Anxiety    Asthma    Elevated blood sugar    Elevated LFTs    Fatty liver    GERD (gastroesophageal reflux disease)  Headache    Herpes simplex type 1 antibody positive    Hyperlipidemia    Hypertension    Prediabetes    Sleep apnea    uses CPAP   Stomach ulcer     Past Surgical History:  Procedure Laterality Date   BREAST REDUCTION SURGERY     COLONOSCOPY     multiple   eye surgeries     POLYPECTOMY      Review of Systems:    All systems reviewed and negative except where noted in HPI.    Physical Exam:  BP (!) 144/72   Pulse 62   Ht 5' 4 (1.626 m)   Wt 206 lb (93.4 kg)   SpO2 95%   BMI 35.36 kg/m  No LMP recorded. Patient is postmenopausal.  General: Well-nourished, well-developed in no acute distress.  Psych: Alert and cooperative,  normal mood and affect.  Imaging Studies: No results found.  Labs: CBC    Component Value Date/Time   WBC 7.0 02/26/2020 1414   RBC 4.69 02/26/2020 1414   HGB 14.7 02/26/2020 1414   HCT 42.2 02/26/2020 1414   PLT 195 02/26/2020 1414   MCV 90 02/26/2020 1414   MCH 31.3 02/26/2020 1414   MCHC 34.8 02/26/2020 1414   RDW 13.0 02/26/2020 1414   LYMPHSABS 2.5 02/26/2020 1414   EOSABS 0.2 02/26/2020 1414   BASOSABS 0.0 02/26/2020 1414    CMP     Component Value Date/Time   NA 144 08/30/2020 0745   K 4.5 08/30/2020 0745   CL 106 08/30/2020 0745   CO2 22 08/30/2020 0745   GLUCOSE 122 (H) 08/30/2020 0745   BUN 16 08/30/2020 0745   CREATININE 0.79 08/30/2020 0745   CALCIUM 10.2 08/30/2020 0745   PROT 6.5 08/30/2020 0745   ALBUMIN 4.2 08/30/2020 0745   AST 26 08/30/2020 0745   ALT 41 (H) 08/30/2020 0745   ALKPHOS 110 08/30/2020 0745   BILITOT 1.2 08/30/2020 0745   GFRNONAA 82 08/30/2020 0745   GFRAA 94 08/30/2020 0745       Assessment and Plan:   Stephanie Alexander is a 57 y.o. y/o female returns for follow-up of:  1.  GERD - Rx pantoprazole  40 Mg 1 tablet once daily, #90, 3 refills - Recommend Lifestyle Modifications to prevent Acid Reflux.  Rec. Avoid coffee, sodas, peppermint, garlic, onions, alcohol, citrus fruits, chocolate, tomatoes, fatty and spicey foods.  Avoid eating 2-3 hours before bedtime.   - We discussed adverse side effects of PPIs to include vitamin deficiencies, osteoporosis, renal insufficiency.  Recommend take lowest effective dose of PPI necessary to control acid reflux.  She can try decreasing Pantoprazole  to 40mg  once every other day.  2.  Mild constipation (side effect of Mounjaro) -Start OTC MiraLAX powder, mix 1 capful in a drink once daily. -Drink 64 ounces of fluids daily.  3.  History of adenomatous colon polyps - 5-year repeat colonoscopy will be due 02/2027.  Ellouise Console, PA-C  Follow up 1 year or as needed pending GI symptoms.

## 2024-01-10 NOTE — Patient Instructions (Addendum)
   For constipation: Start OTC Miralax Powder Mix 1 capful in 6 to 8 ounces of a drink once daily  Recommend high-fiber diet, 30 g of fiber daily Eat fruits, vegetables, and whole grains Drink 64 ounces of water / fluids daily.   We have sent the following medications to your pharmacy for you to pick up at your convenience: Pantoprazole  40mg  once daily   Please follow up sooner if symptoms increase or worsen  Due to recent changes in healthcare laws, you may see the results of your imaging and laboratory studies on MyChart before your provider has had a chance to review them.  We understand that in some cases there may be results that are confusing or concerning to you. Not all laboratory results come back in the same time frame and the provider may be waiting for multiple results in order to interpret others.  Please give us  48 hours in order for your provider to thoroughly review all the results before contacting the office for clarification of your results.   Thank you for trusting me with your gastrointestinal care!   Ellouise Console, PA-C _______________________________________________________  If your blood pressure at your visit was 140/90 or greater, please contact your primary care physician to follow up on this.  _______________________________________________________  If you are age 11 or older, your body mass index should be between 23-30. Your Body mass index is 35.36 kg/m. If this is out of the aforementioned range listed, please consider follow up with your Primary Care Provider.  If you are age 28 or younger, your body mass index should be between 19-25. Your Body mass index is 35.36 kg/m. If this is out of the aformentioned range listed, please consider follow up with your Primary Care Provider.   ________________________________________________________  The [facility] providers would like to encourage you to use MYCHART to communicate with providers for non-urgent  requests or questions.  Due to long hold times on the telephone, sending your provider a message by [facility] may be a faster and more efficient way to get a response.  Please allow 48 business hours for a response.  Please remember that this is for non-urgent requests.  _______________________________________________________

## 2024-01-11 NOTE — Progress Notes (Signed)
 Noted
